# Patient Record
Sex: Female | Born: 1960
Health system: Southern US, Community
[De-identification: ages and names within clinical notes are randomized; demographics above are authoritative.]

## PROBLEM LIST (undated history)

## (undated) DIAGNOSIS — B009 Herpesviral infection, unspecified: Secondary | ICD-10-CM

## (undated) DIAGNOSIS — F32A Depression, unspecified: Secondary | ICD-10-CM

## (undated) DIAGNOSIS — Z9889 Other specified postprocedural states: Secondary | ICD-10-CM

## (undated) DIAGNOSIS — R112 Nausea with vomiting, unspecified: Secondary | ICD-10-CM

## (undated) DIAGNOSIS — M1990 Unspecified osteoarthritis, unspecified site: Secondary | ICD-10-CM

## (undated) HISTORY — PX: JOINT REPLACEMENT: SHX530

## (undated) HISTORY — PX: SHOULDER ARTHROSCOPY: SHX128

## (undated) HISTORY — DX: Herpesviral infection, unspecified: B00.9

---

## 1997-09-03 ENCOUNTER — Ambulatory Visit: Admission: RE | Admit: 1997-09-03 | Discharge: 1997-09-03 | Payer: Self-pay | Admitting: Family Medicine

## 1998-12-23 ENCOUNTER — Ambulatory Visit (HOSPITAL_COMMUNITY): Admission: RE | Admit: 1998-12-23 | Discharge: 1998-12-23 | Payer: Self-pay | Admitting: *Deleted

## 1998-12-23 ENCOUNTER — Encounter: Payer: Self-pay | Admitting: *Deleted

## 1999-05-03 ENCOUNTER — Encounter: Payer: Self-pay | Admitting: Orthopedic Surgery

## 1999-05-03 ENCOUNTER — Encounter: Admission: RE | Admit: 1999-05-03 | Discharge: 1999-05-03 | Payer: Self-pay | Admitting: Orthopedic Surgery

## 1999-05-08 ENCOUNTER — Ambulatory Visit (HOSPITAL_BASED_OUTPATIENT_CLINIC_OR_DEPARTMENT_OTHER): Admission: RE | Admit: 1999-05-08 | Discharge: 1999-05-09 | Payer: Self-pay | Admitting: Orthopedic Surgery

## 1999-08-22 ENCOUNTER — Other Ambulatory Visit: Admission: RE | Admit: 1999-08-22 | Discharge: 1999-08-22 | Payer: Self-pay | Admitting: Gynecology

## 1999-09-08 ENCOUNTER — Encounter: Payer: Self-pay | Admitting: General Surgery

## 1999-09-08 ENCOUNTER — Encounter: Admission: RE | Admit: 1999-09-08 | Discharge: 1999-09-08 | Payer: Self-pay | Admitting: General Surgery

## 2000-09-03 ENCOUNTER — Other Ambulatory Visit: Admission: RE | Admit: 2000-09-03 | Discharge: 2000-09-03 | Payer: Self-pay | Admitting: Gynecology

## 2000-09-09 ENCOUNTER — Encounter: Payer: Self-pay | Admitting: General Surgery

## 2000-09-09 ENCOUNTER — Encounter: Admission: RE | Admit: 2000-09-09 | Discharge: 2000-09-09 | Payer: Self-pay | Admitting: General Surgery

## 2001-09-10 ENCOUNTER — Encounter: Payer: Self-pay | Admitting: General Surgery

## 2001-09-10 ENCOUNTER — Encounter: Admission: RE | Admit: 2001-09-10 | Discharge: 2001-09-10 | Payer: Self-pay | Admitting: General Surgery

## 2001-09-11 ENCOUNTER — Other Ambulatory Visit: Admission: RE | Admit: 2001-09-11 | Discharge: 2001-09-11 | Payer: Self-pay | Admitting: Gynecology

## 2002-03-05 ENCOUNTER — Encounter: Payer: Self-pay | Admitting: General Surgery

## 2002-03-05 ENCOUNTER — Ambulatory Visit (HOSPITAL_COMMUNITY): Admission: RE | Admit: 2002-03-05 | Discharge: 2002-03-05 | Payer: Self-pay | Admitting: General Surgery

## 2002-03-06 ENCOUNTER — Ambulatory Visit (HOSPITAL_COMMUNITY): Admission: RE | Admit: 2002-03-06 | Discharge: 2002-03-06 | Payer: Self-pay | Admitting: General Surgery

## 2002-03-06 ENCOUNTER — Encounter: Payer: Self-pay | Admitting: General Surgery

## 2002-09-14 ENCOUNTER — Encounter: Admission: RE | Admit: 2002-09-14 | Discharge: 2002-09-14 | Payer: Self-pay | Admitting: Gynecology

## 2002-09-14 ENCOUNTER — Encounter: Payer: Self-pay | Admitting: Gynecology

## 2002-10-16 ENCOUNTER — Emergency Department (HOSPITAL_COMMUNITY): Admission: EM | Admit: 2002-10-16 | Discharge: 2002-10-16 | Payer: Self-pay | Admitting: Emergency Medicine

## 2003-03-11 ENCOUNTER — Encounter (HOSPITAL_COMMUNITY): Admission: RE | Admit: 2003-03-11 | Discharge: 2003-06-09 | Payer: Self-pay | Admitting: General Surgery

## 2003-09-24 ENCOUNTER — Encounter: Admission: RE | Admit: 2003-09-24 | Discharge: 2003-09-24 | Payer: Self-pay | Admitting: General Surgery

## 2004-03-10 ENCOUNTER — Encounter: Admission: RE | Admit: 2004-03-10 | Discharge: 2004-03-10 | Payer: Self-pay | Admitting: General Surgery

## 2004-03-10 ENCOUNTER — Encounter (HOSPITAL_COMMUNITY): Admission: RE | Admit: 2004-03-10 | Discharge: 2004-06-08 | Payer: Self-pay | Admitting: General Surgery

## 2004-08-30 ENCOUNTER — Other Ambulatory Visit: Admission: RE | Admit: 2004-08-30 | Discharge: 2004-08-30 | Payer: Self-pay | Admitting: Gynecology

## 2005-03-19 ENCOUNTER — Encounter: Admission: RE | Admit: 2005-03-19 | Discharge: 2005-03-19 | Payer: Self-pay | Admitting: General Surgery

## 2005-09-03 ENCOUNTER — Other Ambulatory Visit: Admission: RE | Admit: 2005-09-03 | Discharge: 2005-09-03 | Payer: Self-pay | Admitting: Gynecology

## 2006-03-20 ENCOUNTER — Encounter: Admission: RE | Admit: 2006-03-20 | Discharge: 2006-03-20 | Payer: Self-pay | Admitting: General Surgery

## 2007-03-24 ENCOUNTER — Encounter: Admission: RE | Admit: 2007-03-24 | Discharge: 2007-03-24 | Payer: Self-pay | Admitting: General Surgery

## 2010-04-23 ENCOUNTER — Encounter: Payer: Self-pay | Admitting: General Surgery
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
## 2012-11-16 ENCOUNTER — Emergency Department (HOSPITAL_COMMUNITY)
Admission: EM | Admit: 2012-11-16 | Discharge: 2012-11-16 | Disposition: A | Discharge status: Home / Self Care | Payer: BC Managed Care – PPO | Attending: Emergency Medicine | Admitting: Emergency Medicine

## 2012-11-16 ENCOUNTER — Emergency Department (HOSPITAL_COMMUNITY): Payer: BC Managed Care – PPO

## 2012-11-16 ENCOUNTER — Encounter (HOSPITAL_COMMUNITY): Payer: Self-pay

## 2012-11-16 DIAGNOSIS — S01112A Laceration without foreign body of left eyelid and periocular area, initial encounter: Secondary | ICD-10-CM

## 2012-11-16 DIAGNOSIS — S8990XA Unspecified injury of unspecified lower leg, initial encounter: Secondary | ICD-10-CM | POA: Insufficient documentation

## 2012-11-16 DIAGNOSIS — IMO0002 Reserved for concepts with insufficient information to code with codable children: Secondary | ICD-10-CM | POA: Insufficient documentation

## 2012-11-16 DIAGNOSIS — T07XXXA Unspecified multiple injuries, initial encounter: Secondary | ICD-10-CM

## 2012-11-16 DIAGNOSIS — S8391XA Sprain of unspecified site of right knee, initial encounter: Secondary | ICD-10-CM

## 2012-11-16 DIAGNOSIS — S058X9A Other injuries of unspecified eye and orbit, initial encounter: Secondary | ICD-10-CM | POA: Insufficient documentation

## 2012-11-16 DIAGNOSIS — S99929A Unspecified injury of unspecified foot, initial encounter: Secondary | ICD-10-CM | POA: Insufficient documentation

## 2012-11-16 DIAGNOSIS — S0003XA Contusion of scalp, initial encounter: Secondary | ICD-10-CM | POA: Insufficient documentation

## 2012-11-16 DIAGNOSIS — S0083XA Contusion of other part of head, initial encounter: Secondary | ICD-10-CM

## 2012-11-16 DIAGNOSIS — Y92410 Unspecified street and highway as the place of occurrence of the external cause: Secondary | ICD-10-CM | POA: Insufficient documentation

## 2012-11-16 DIAGNOSIS — S0990XA Unspecified injury of head, initial encounter: Secondary | ICD-10-CM

## 2012-11-16 DIAGNOSIS — Z23 Encounter for immunization: Secondary | ICD-10-CM | POA: Insufficient documentation

## 2012-11-16 DIAGNOSIS — Y9389 Activity, other specified: Secondary | ICD-10-CM | POA: Insufficient documentation

## 2012-11-16 DIAGNOSIS — Z79899 Other long term (current) drug therapy: Secondary | ICD-10-CM | POA: Insufficient documentation

## 2012-11-16 DIAGNOSIS — Z87891 Personal history of nicotine dependence: Secondary | ICD-10-CM | POA: Insufficient documentation

## 2012-11-16 DIAGNOSIS — Z792 Long term (current) use of antibiotics: Secondary | ICD-10-CM | POA: Insufficient documentation

## 2012-11-16 HISTORY — DX: Unspecified osteoarthritis, unspecified site: M19.90

## 2012-11-16 MED ORDER — CYCLOBENZAPRINE HCL 10 MG PO TABS: 10.0000 mg | ORAL_TABLET | Freq: Two times a day (BID) | ORAL | Status: DC | PRN

## 2012-11-16 MED ORDER — TETANUS-DIPHTH-ACELL PERTUSSIS 5-2.5-18.5 LF-MCG/0.5 IM SUSP
0.5000 mL | Freq: Once | INTRAMUSCULAR | Status: AC
  Administered 2012-11-16: 0.5 mL via INTRAMUSCULAR
  Filled 2012-11-16: qty 0.5

## 2012-11-16 MED ORDER — IBUPROFEN 200 MG PO TABS
600.0000 mg | ORAL_TABLET | Freq: Once | ORAL | Status: AC
  Administered 2012-11-16: 600 mg via ORAL
  Filled 2012-11-16: qty 3

## 2012-11-16 MED ORDER — HYDROCODONE-ACETAMINOPHEN 5-325 MG PO TABS: 1.0000 | ORAL_TABLET | Freq: Four times a day (QID) | ORAL | Status: DC | PRN

## 2012-11-16 MED ORDER — IBUPROFEN 600 MG PO TABS: 600.0000 mg | ORAL_TABLET | Freq: Four times a day (QID) | ORAL | Status: DC | PRN

## 2012-11-16 MED ORDER — OXYCODONE-ACETAMINOPHEN 5-325 MG PO TABS: 1.0000 | ORAL_TABLET | Freq: Once | ORAL | Status: DC

## 2012-11-16 NOTE — ED Notes (Signed)
Pt does not want Percocet at this time.  Will monitor.

## 2012-11-16 NOTE — ED Notes (Signed)
Pt was given an ice pack for her face, right hand, right knee and left ankle

## 2012-11-16 NOTE — ED Provider Notes (Signed)
Medical screening examination/treatment/procedure(s) were performed by non-physician practitioner and as supervising physician I was immediately available for consultation/collaboration.  Layla Maw Jourdyn Hasler, DO 11/16/12 1656
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
## 2013-04-20 ENCOUNTER — Other Ambulatory Visit: Payer: Self-pay

## 2013-04-20 DIAGNOSIS — Z1231 Encounter for screening mammogram for malignant neoplasm of breast: Secondary | ICD-10-CM

## 2013-05-12 ENCOUNTER — Ambulatory Visit
Admission: RE | Admit: 2013-05-12 | Discharge: 2013-05-12 | Disposition: A | Discharge status: Home / Self Care | Payer: BC Managed Care – PPO | Source: Ambulatory Visit

## 2013-05-12 DIAGNOSIS — Z1231 Encounter for screening mammogram for malignant neoplasm of breast: Secondary | ICD-10-CM

## 2013-05-18 ENCOUNTER — Ambulatory Visit: Payer: Self-pay | Admitting: Gynecology

## 2013-05-18 ENCOUNTER — Ambulatory Visit (INDEPENDENT_AMBULATORY_CARE_PROVIDER_SITE_OTHER): Payer: BC Managed Care – PPO | Admitting: Gynecology

## 2013-05-18 ENCOUNTER — Encounter: Payer: Self-pay | Admitting: Gynecology

## 2013-05-18 ENCOUNTER — Other Ambulatory Visit (HOSPITAL_COMMUNITY)
Admission: RE | Admit: 2013-05-18 | Discharge: 2013-05-18 | Disposition: A | Discharge status: Home / Self Care | Payer: BC Managed Care – PPO | Source: Ambulatory Visit | Attending: Gynecology | Admitting: Gynecology

## 2013-05-18 VITALS — BP 112/64 | Ht 67.0 in | Wt 135.0 lb

## 2013-05-18 DIAGNOSIS — Z01419 Encounter for gynecological examination (general) (routine) without abnormal findings: Secondary | ICD-10-CM

## 2013-05-18 DIAGNOSIS — Z1151 Encounter for screening for human papillomavirus (HPV): Secondary | ICD-10-CM | POA: Insufficient documentation

## 2013-05-18 LAB — CBC WITH DIFFERENTIAL/PLATELET
Basophils Absolute: 0 10*3/uL (ref 0.0–0.1)
Basophils Relative: 1 % (ref 0–1)
Eosinophils Absolute: 0.1 10*3/uL (ref 0.0–0.7)
Eosinophils Relative: 3 % (ref 0–5)
HCT: 35.7 % — ABNORMAL LOW (ref 36.0–46.0)
Hemoglobin: 12.9 g/dL (ref 12.0–15.0)
Lymphocytes Relative: 43 % (ref 12–46)
Lymphs Abs: 1.8 10*3/uL (ref 0.7–4.0)
MCH: 33 pg (ref 26.0–34.0)
MCHC: 36.1 g/dL — ABNORMAL HIGH (ref 30.0–36.0)
MCV: 91.3 fL (ref 78.0–100.0)
Monocytes Absolute: 0.3 10*3/uL (ref 0.1–1.0)
Monocytes Relative: 7 % (ref 3–12)
Neutro Abs: 2 10*3/uL (ref 1.7–7.7)
Neutrophils Relative %: 46 % (ref 43–77)
Platelets: 347 10*3/uL (ref 150–400)
RBC: 3.91 MIL/uL (ref 3.87–5.11)
RDW: 12.8 % (ref 11.5–15.5)
WBC: 4.2 10*3/uL (ref 4.0–10.5)

## 2013-05-18 LAB — COMPREHENSIVE METABOLIC PANEL
ALT: 15 U/L (ref 0–35)
AST: 11 U/L (ref 0–37)
Albumin: 4.5 g/dL (ref 3.5–5.2)
Alkaline Phosphatase: 50 U/L (ref 39–117)
BUN: 9 mg/dL (ref 6–23)
CO2: 30 mEq/L (ref 19–32)
Calcium: 9.7 mg/dL (ref 8.4–10.5)
Chloride: 103 mEq/L (ref 96–112)
Creat: 0.71 mg/dL (ref 0.50–1.10)
Glucose, Bld: 86 mg/dL (ref 70–99)
Potassium: 3.9 mEq/L (ref 3.5–5.3)
Sodium: 140 mEq/L (ref 135–145)
Total Bilirubin: 0.3 mg/dL (ref 0.2–1.2)
Total Protein: 6.8 g/dL (ref 6.0–8.3)

## 2013-05-18 LAB — LIPID PANEL
Cholesterol: 153 mg/dL (ref 0–200)
HDL: 45 mg/dL (ref >39)
LDL Cholesterol: 96 mg/dL (ref 0–99)
Total CHOL/HDL Ratio: 3.4 Ratio
Triglycerides: 58 mg/dL (ref <150)
VLDL: 12 mg/dL (ref 0–40)

## 2013-05-18 NOTE — Patient Instructions (Signed)
Follow up in one year, sooner as needed. 

## 2013-05-18 NOTE — Addendum Note (Signed)
Addended by: Dayna BarkerGARDNER, Gaia Gullikson K on: 05/18/2013 02:15 PM   Modules accepted: Orders

## 2013-05-18 NOTE — Progress Notes (Signed)
[facility] 03/19/1961 [id number]        53 y.o.  G2P0020 new patient for annual exam.  Former patient Dr. Nicholas LoseLomax although has not been seen in several years.  Past medical history,surgical history, problem list, medications, allergies, family history and social history were all reviewed and documented in the EPIC chart.  ROS:  Performed and pertinent positives and negatives are included in the history, assessment and plan .  Exam: Kim assistant Filed Vitals:   05/18/13 1327  BP: 112/64  Height: 5\' 7"  (1.702 m)  Weight: 135 lb (61.236 kg)   General appearance  Normal Skin grossly normal Head/Neck normal with no cervical or supraclavicular adenopathy thyroid normal Lungs  clear Cardiac RR, without RMG Abdominal  soft, nontender, without masses, organomegaly or hernia Breasts  examined lying and sitting without masses, retractions, discharge or axillary adenopathy. Pelvic  Ext/BUS/vagina  Normal  Cervix  Normal. Pap/HPV  Uterus  anteverted, normal size, shape and contour, midline and mobile nontender   Adnexa  Without masses or tenderness    Anus and perineum  Normal   Rectovaginal  Normal sphincter tone without palpated masses or tenderness.    Assessment/Plan:  53 y.o. 562P0020 female for annual exam.   1. Postmenopausal with LMP 2013. No bleeding since then. Not having significant hot flashes or night sweats. Currently not sexually active. Will monitor symptoms. Followup if any issues. Patient knows to report any vaginal bleeding. 2. Pap smear 2013. Pap/HPV today. No history of abnormal Pap smears previously. Plan 3-5 year repeat is normal. 3. Mammography 05/2013. Continue with annual mammography. SBE monthly reviewed. 4. Health maintenance. Baseline CBC comprehensive metabolic panel lipid profile urinalysis TSH vitamin D ordered. Followup one year, sooner as needed.   Note: This document was prepared with digital dictation and possible smart phrase technology. Any  transcriptional errors that result from this process are unintentional.   Dara LordsFONTAINE,Cortny Bambach P MD, 2:00 PM 05/18/2013

## 2013-05-19 LAB — URINALYSIS W MICROSCOPIC + REFLEX CULTURE
Bacteria, UA: NONE SEEN
Bilirubin Urine: NEGATIVE
Casts: NONE SEEN
Crystals: NONE SEEN
Glucose, UA: NEGATIVE mg/dL
Hgb urine dipstick: NEGATIVE
Ketones, ur: NEGATIVE mg/dL
Leukocytes, UA: NEGATIVE
Nitrite: NEGATIVE
Protein, ur: NEGATIVE mg/dL
Specific Gravity, Urine: 1.005 (ref 1.005–1.030)
Squamous Epithelial / HPF: NONE SEEN
Urobilinogen, UA: 0.2 mg/dL (ref 0.0–1.0)
pH: 6.5 (ref 5.0–8.0)

## 2013-05-19 LAB — VITAMIN D 25 HYDROXY (VIT D DEFICIENCY, FRACTURES): Vit D, 25-Hydroxy: 76 ng/mL (ref 30–89)

## 2013-05-19 LAB — TSH: TSH: 1.991 u[IU]/mL (ref 0.350–4.500)

## 2014-02-01 ENCOUNTER — Encounter: Payer: Self-pay | Admitting: Gynecology

## 2014-02-02 ENCOUNTER — Other Ambulatory Visit: Payer: Self-pay | Admitting: Dermatology

## 2014-04-12 ENCOUNTER — Other Ambulatory Visit: Payer: Self-pay

## 2014-04-12 DIAGNOSIS — Z1231 Encounter for screening mammogram for malignant neoplasm of breast: Secondary | ICD-10-CM

## 2014-05-14 ENCOUNTER — Ambulatory Visit
Admission: RE | Admit: 2014-05-14 | Discharge: 2014-05-14 | Disposition: A | Discharge status: Home / Self Care | Payer: BLUE CROSS/BLUE SHIELD | Source: Ambulatory Visit

## 2014-05-14 DIAGNOSIS — Z1231 Encounter for screening mammogram for malignant neoplasm of breast: Secondary | ICD-10-CM

## 2014-05-21 ENCOUNTER — Encounter: Payer: BC Managed Care – PPO | Admitting: Gynecology

## 2014-06-28 DIAGNOSIS — M7989 Other specified soft tissue disorders: Secondary | ICD-10-CM | POA: Insufficient documentation

## 2014-07-01 ENCOUNTER — Ambulatory Visit
Admission: RE | Admit: 2014-07-01 | Discharge: 2014-07-01 | Disposition: A | Discharge status: Home / Self Care | Payer: BLUE CROSS/BLUE SHIELD | Source: Ambulatory Visit | Attending: Sports Medicine | Admitting: Sports Medicine

## 2014-07-01 ENCOUNTER — Ambulatory Visit (INDEPENDENT_AMBULATORY_CARE_PROVIDER_SITE_OTHER): Payer: BLUE CROSS/BLUE SHIELD | Admitting: Sports Medicine

## 2014-07-01 ENCOUNTER — Encounter: Payer: Self-pay | Admitting: Sports Medicine

## 2014-07-01 VITALS — BP 126/70 | Ht 68.0 in | Wt 135.0 lb

## 2014-07-01 DIAGNOSIS — S76319A Strain of muscle, fascia and tendon of the posterior muscle group at thigh level, unspecified thigh, initial encounter: Secondary | ICD-10-CM

## 2014-07-01 DIAGNOSIS — F988 Other specified behavioral and emotional disorders with onset usually occurring in childhood and adolescence: Secondary | ICD-10-CM | POA: Insufficient documentation

## 2014-07-01 MED ORDER — MELOXICAM 7.5 MG PO TABS: 7.5000 mg | ORAL_TABLET | Freq: Every day | ORAL | Status: DC

## 2014-07-01 NOTE — Patient Instructions (Signed)
Matt Clance [email or web address] [phone]   Hamstring Strain with Rehab The hamstring muscle and tendons are vulnerable to muscle or tendon tear (strain). Hamstring tears cause pain and inflammation in the backside of the thigh, where the hamstring muscles are located. The hamstring is comprised of three muscles that are responsible for straightening the hip, bending the knee, and stabilizing the knee. These muscles are important for walking, running, and jumping. Hamstring strain is the most common injury of the thigh. Hamstring strains are classified as grade 1 or 2 strains. Grade 1 strains cause pain, but the tendon is not lengthened. Grade 2 strains include a lengthened ligament due to the ligament being stretched or partially ruptured. With grade 2 strains there is still function, although the function may be decreased.  SYMPTOMS   Pain, tenderness, swelling, warmth, or redness over the hamstring muscles, at the back of the thigh.  Pain that gets worse during and after intense activity.  A "pop" heard in the area, at the time of injury.  Muscle spasm in the hamstring muscles.  Pain or weakness with running, jumping, or bending the knee against resistance.  Crackling sound (crepitation) when the tendon is moved or touched.  Bruising (contusion) in the thigh within 48 hours of injury.  Loss of fullness of the muscle, or area of muscle bulging in the case of a complete rupture. CAUSES  A muscle strain occurs when a force is placed on the muscle or tendon that is greater than it can withstand. Common causes of injury include:  Strain from overuse or sudden increase in the frequency, duration, or intensity of activity.  Single violent blow or force to the back of the knee or the hamstring area of the thigh. RISK INCREASES WITH:  Sports that require quick starts (sprinting, racquetball, tennis).  Sports that require jumping (basketball, volleyball).  Kicking  sports and water skiing.  Contact sports (soccer, football).  Poor strength and flexibility.  Failure to warm up properly before activity.  Previous thigh, knee, or pelvis injury.  Poor exercise technique.  Poor posture. PREVENTION  Maintain physical fitness:  Strength, flexibility, and endurance.  Cardiovascular fitness.  Learn and use proper exercise technique and posture.  Wear proper fitted and padded protective equipment. PROGNOSIS  If treated properly, hamstring strains are usually curable in 2 to 6 weeks. RELATED COMPLICATIONS   Longer healing time, if not properly treated or if not given adequate time to heal.  Chronically inflamed tendon, causing persistent pain with activity that may progress to constant pain.  Recurring symptoms, if activity is resumed too soon.  Vulnerable to repeated injury (in up to 25% of cases). TREATMENT  Treatment first involves the use of ice and medication to help reduce pain and inflammation. It is also important to complete strengthening and stretching exercises, as well as modifying any activities that aggravate the symptoms. These exercises may be completed at home or with a therapist. Your caregiver may recommend the use of crutches to help reduce pain and discomfort, especially is the strain is severe enough to cause limping. If the tendon has pulled away from the bone, then surgery is necessary to reattach it. MEDICATION   If pain medicine is needed, nonsteroidal anti-inflammatory medicines (aspirin and ibuprofen), or other minor pain relievers (acetaminophen), are often advised.  Do not take pain medicine for 7 days before surgery.  Prescription pain relievers may be given if your caregiver thinks they are needed. Use only as directed and only as much as you  need.  Corticosteroid injections may be recommended. However, these injections should only be used for serious cases, as they can only be given a certain number of  times.  Ointments applied to the skin may be beneficial. HEAT AND COLD  Cold treatment (icing) relieves pain and reduces inflammation. Cold treatment should be applied for 10 to 15 minutes every 2 to 3 hours, and immediately after activity that aggravates your symptoms. Use ice packs or an ice massage.  Heat treatment may be used before performing the stretching and strengthening activities prescribed by your caregiver, physical therapist, or athletic trainer. Use a heat pack or a warm water soak. SEEK MEDICAL CARE IF:   Symptoms get worse or do not improve in 2 weeks, despite treatment.  New, unexplained symptoms develop. (Drugs used in treatment may produce side effects.) . Document Released: 03/19/2005 Document Revised: 06/11/2011 Document Reviewed: 07/01/2008 [facility]ExitCare Patient Information 2015 [place]. This information is not intended to replace advice given to you by your health care provider. Make sure you discuss any questions you have with your health care provider.

## 2014-07-09 DIAGNOSIS — S76319A Strain of muscle, fascia and tendon of the posterior muscle group at thigh level, unspecified thigh, initial encounter: Secondary | ICD-10-CM | POA: Insufficient documentation

## 2014-07-09 NOTE — Progress Notes (Signed)
  Christine Rangel - 54 y.o. female MRN [id number]  Date of birth: 06/01/1960  SUBJECTIVE: CC: 1.  bilateral gluteal pain, reevaluation      HPI:   patient reports for reevaluation of bilateral gluteal pain. Reports experiencing an episode in 2013 was training for marathon in doing sprints in the cold weather where I lateral gluteals cramped in bilateral hamstring cramps occurred at the same time. She's had intermittent pain since that time. Worse following cycling especially on hilly courses  Was spitting and yoga last week  & experience left hamstring irritation been having pain with settings that time.  Denies fevers, chills, night sweats  No pain radiating down past the posterior aspect of the leg.  Denies any numbness, surgical anesthesia or midline back pain      ROS:  per HPI    HISTORY:  Past Medical, Surgical, Social, and Family History reviewed & updated per EMR.  Pertinent Historical Findings include: Social History   Occupational History  . Not on file.   Social History Main Topics  . Smoking status: Former Games developermoker  . Smokeless tobacco: Not on file  . Alcohol Use: Yes     Comment: Rare  . Drug Use: No  . Sexual Activity: No    No specialty comments available. Problem  Hamstring Muscle Strain    OBJECTIVE:  VS:   HT:5\' 8"  (172.7 cm)   WT:135 lb (61.236 kg)  BMI:20.6          BP:126/70 mmHg  HR: bpm  TEMP: ( )  RESP:   PHYSICAL EXAM: GENERAL: Adult Caucasian female. No acute distress PSYCH: Alert and appropriately interactive. Slightly anxious SKIN: No open skin lesions or abnormal skin markings on areas inspected as below VASCULAR: Bilateral DP and PT pulses 2+/4, no pretibial edema NEURO: Lower extremity strength is 5+/5 in all myotomes; sensation is intact to light touch in all dermatomes. BILATERAL LEGS: Bilateral negative straight leg raise, popliteal inguinal in the left 28, 110 on the right No significant gluteal pain to palpation. No significant  sciatic notch tenderness. OSTEOPATHIC EXAM: Left anterior innominate, ASIS compression test positive on left. Right on right sacral torsion. Leg lengths are symmetric  ASSESSMENT & PLAN: See problem based charting & AVS for additional documentation Problem List Items Addressed This Visit    Hamstring muscle strain - Primary    Acute muscle strain in the setting of bilateral gluteal pain Discussed the bike fit as this may be contributing to her bilateral hamstring and gluteal tightness especially in the seated position. Seems to be worse with hilly courses suggesting over recruitment secondary to position on climbs. Information for [employer] at [employer] for a Ret[employer] bike fit Eval with plain film x-rays of the back for evaluation of potential degenerative changes that may be causing a neuromuscular irritability predisposing her for injury Core stabilization exercises, hamstring eccentric exercises & change in position recommended Discussed the patient and obtain video pictures of her on the bicycle I can potentially make suggestions return.       Relevant Orders   DG Lumbar Spine 2-3 Views (Completed)     FOLLOW UP:  Return in about 6 weeks (around 08/12/2014).

## 2014-07-09 NOTE — Assessment & Plan Note (Signed)
Acute muscle strain in the setting of bilateral gluteal pain Discussed the bike fit as this may be contributing to her bilateral hamstring and gluteal tightness especially in the seated position. Seems to be worse with hilly courses suggesting over recruitment secondary to position on climbs. Information for [employer] for a Retule bike fit Eval with plain film x-rays of the back for evaluation of potential degenerative changes that may be causing a neuromuscular irritability predisposing her for injury Core stabilization exercises, hamstring eccentric exercises & change in position recommended Discussed the patient and obtain video pictures of her on the bicycle I can potentially make suggestions return.

## 2014-07-16 ENCOUNTER — Encounter: Payer: Self-pay | Admitting: Gynecology

## 2014-07-16 ENCOUNTER — Ambulatory Visit (INDEPENDENT_AMBULATORY_CARE_PROVIDER_SITE_OTHER): Payer: BLUE CROSS/BLUE SHIELD | Admitting: Gynecology

## 2014-07-16 VITALS — BP 118/74 | Ht 67.0 in | Wt 138.0 lb

## 2014-07-16 DIAGNOSIS — Z01419 Encounter for gynecological examination (general) (routine) without abnormal findings: Secondary | ICD-10-CM

## 2014-07-16 LAB — COMPREHENSIVE METABOLIC PANEL
ALT: 19 U/L (ref 0–35)
AST: 19 U/L (ref 0–37)
Albumin: 4.4 g/dL (ref 3.5–5.2)
Alkaline Phosphatase: 59 U/L (ref 39–117)
BUN: 8 mg/dL (ref 6–23)
CALCIUM: 9.8 mg/dL (ref 8.4–10.5)
CHLORIDE: 103 meq/L (ref 96–112)
CO2: 29 mEq/L (ref 19–32)
Creat: 0.74 mg/dL (ref 0.50–1.10)
Glucose, Bld: 69 mg/dL — ABNORMAL LOW (ref 70–99)
POTASSIUM: 4.4 meq/L (ref 3.5–5.3)
Sodium: 137 mEq/L (ref 135–145)
Total Bilirubin: 0.7 mg/dL (ref 0.2–1.2)
Total Protein: 6.9 g/dL (ref 6.0–8.3)

## 2014-07-16 LAB — CBC WITH DIFFERENTIAL/PLATELET
BASOS ABS: 0 10*3/uL (ref 0.0–0.1)
Basophils Relative: 0 % (ref 0–1)
EOS PCT: 2 % (ref 0–5)
Eosinophils Absolute: 0.1 10*3/uL (ref 0.0–0.7)
HCT: 39.8 % (ref 36.0–46.0)
Hemoglobin: 13.5 g/dL (ref 12.0–15.0)
LYMPHS PCT: 38 % (ref 12–46)
Lymphs Abs: 1.8 10*3/uL (ref 0.7–4.0)
MCH: 31.9 pg (ref 26.0–34.0)
MCHC: 33.9 g/dL (ref 30.0–36.0)
MCV: 94.1 fL (ref 78.0–100.0)
MONO ABS: 0.4 10*3/uL (ref 0.1–1.0)
MPV: 8.5 fL — ABNORMAL LOW (ref 8.6–12.4)
Monocytes Relative: 8 % (ref 3–12)
Neutro Abs: 2.4 10*3/uL (ref 1.7–7.7)
Neutrophils Relative %: 52 % (ref 43–77)
Platelets: 333 10*3/uL (ref 150–400)
RBC: 4.23 MIL/uL (ref 3.87–5.11)
RDW: 12.9 % (ref 11.5–15.5)
WBC: 4.7 10*3/uL (ref 4.0–10.5)

## 2014-07-16 LAB — LIPID PANEL
Cholesterol: 161 mg/dL (ref 0–200)
HDL: 41 mg/dL — ABNORMAL LOW (ref >=46)
LDL Cholesterol: 111 mg/dL — ABNORMAL HIGH (ref 0–99)
Total CHOL/HDL Ratio: 3.9 Ratio
Triglycerides: 46 mg/dL (ref <150)
VLDL: 9 mg/dL (ref 0–40)

## 2014-07-16 LAB — TSH: TSH: 1.631 u[IU]/mL (ref 0.350–4.500)

## 2014-07-16 NOTE — Patient Instructions (Signed)
You may obtain a copy of any labs that were done today by logging onto MyChart as outlined in the instructions provided with your AVS (after visit summary). The office will not call with normal lab results but certainly if there are any significant abnormalities then we will contact you.   Health Maintenance, Female A healthy lifestyle and preventative care can promote health and wellness.  Maintain regular health, dental, and eye exams.  Eat a healthy diet. Foods like vegetables, fruits, whole grains, low-fat dairy products, and lean protein foods contain the nutrients you need without too many calories. Decrease your intake of foods high in solid fats, added sugars, and salt. Get information about a proper diet from your caregiver, if necessary.  Regular physical exercise is one of the most important things you can do for your health. Most adults should get at least 150 minutes of moderate-intensity exercise (any activity that increases your heart rate and causes you to sweat) each week. In addition, most adults need muscle-strengthening exercises on 2 or more days a week.   Maintain a healthy weight. The body mass index (BMI) is a screening tool to identify possible weight problems. It provides an estimate of body fat based on height and weight. Your caregiver can help determine your BMI, and can help you achieve or maintain a healthy weight. For adults 20 years and older:  A BMI below 18.5 is considered underweight.  A BMI of 18.5 to 24.9 is normal.  A BMI of 25 to 29.9 is considered overweight.  A BMI of 30 and above is considered obese.  Maintain normal blood lipids and cholesterol by exercising and minimizing your intake of saturated fat. Eat a balanced diet with plenty of fruits and vegetables. Blood tests for lipids and cholesterol should begin at age 61 and be repeated every 5 years. If your lipid or cholesterol levels are high, you are over 50, or you are a high risk for heart  disease, you may need your cholesterol levels checked more frequently.Ongoing high lipid and cholesterol levels should be treated with medicines if diet and exercise are not effective.  If you smoke, find out from your caregiver how to quit. If you do not use tobacco, do not start.  Lung cancer screening is recommended for adults aged 33 80 years who are at high risk for developing lung cancer because of a history of smoking. Yearly low-dose computed tomography (CT) is recommended for people who have at least a 30-pack-year history of smoking and are a current smoker or have quit within the past 15 years. A pack year of smoking is smoking an average of 1 pack of cigarettes a day for 1 year (for example: 1 pack a day for 30 years or 2 packs a day for 15 years). Yearly screening should continue until the smoker has stopped smoking for at least 15 years. Yearly screening should also be stopped for people who develop a health problem that would prevent them from having lung cancer treatment.  If you are pregnant, do not drink alcohol. If you are breastfeeding, be very cautious about drinking alcohol. If you are not pregnant and choose to drink alcohol, do not exceed 1 drink per day. One drink is considered to be 12 ounces (355 mL) of beer, 5 ounces (148 mL) of wine, or 1.5 ounces (44 mL) of liquor.  Avoid use of street drugs. Do not share needles with anyone. Ask for help if you need support or instructions about stopping  the use of drugs.  High blood pressure causes heart disease and increases the risk of stroke. Blood pressure should be checked at least every 1 to 2 years. Ongoing high blood pressure should be treated with medicines, if weight loss and exercise are not effective.  If you are 59 to 53 years old, ask your caregiver if you should take aspirin to prevent strokes.  Diabetes screening involves taking a blood sample to check your fasting blood sugar level. This should be done once every 3  years, after age 91, if you are within normal weight and without risk factors for diabetes. Testing should be considered at a younger age or be carried out more frequently if you are overweight and have at least 1 risk factor for diabetes.  Breast cancer screening is essential preventative care for women. You should practice "breast self-awareness." This means understanding the normal appearance and feel of your breasts and may include breast self-examination. Any changes detected, no matter how small, should be reported to a caregiver. Women in their 66s and 30s should have a clinical breast exam (CBE) by a caregiver as part of a regular health exam every 1 to 3 years. After age 101, women should have a CBE every year. Starting at age 100, women should consider having a mammogram (breast X-ray) every year. Women who have a family history of breast cancer should talk to their caregiver about genetic screening. Women at a high risk of breast cancer should talk to their caregiver about having an MRI and a mammogram every year.  Breast cancer gene (BRCA)-related cancer risk assessment is recommended for women who have family members with BRCA-related cancers. BRCA-related cancers include breast, ovarian, tubal, and peritoneal cancers. Having family members with these cancers may be associated with an increased risk for harmful changes (mutations) in the breast cancer genes BRCA1 and BRCA2. Results of the assessment will determine the need for genetic counseling and BRCA1 and BRCA2 testing.  The Pap test is a screening test for cervical cancer. Women should have a Pap test starting at age 57. Between ages 25 and 35, Pap tests should be repeated every 2 years. Beginning at age 37, you should have a Pap test every 3 years as long as the past 3 Pap tests have been normal. If you had a hysterectomy for a problem that was not cancer or a condition that could lead to cancer, then you no longer need Pap tests. If you are  between ages 50 and 76, and you have had normal Pap tests going back 10 years, you no longer need Pap tests. If you have had past treatment for cervical cancer or a condition that could lead to cancer, you need Pap tests and screening for cancer for at least 20 years after your treatment. If Pap tests have been discontinued, risk factors (such as a new sexual partner) need to be reassessed to determine if screening should be resumed. Some women have medical problems that increase the chance of getting cervical cancer. In these cases, your caregiver may recommend more frequent screening and Pap tests.  The human papillomavirus (HPV) test is an additional test that may be used for cervical cancer screening. The HPV test looks for the virus that can cause the cell changes on the cervix. The cells collected during the Pap test can be tested for HPV. The HPV test could be used to screen women aged 44 years and older, and should be used in women of any age  who have unclear Pap test results. After the age of 55, women should have HPV testing at the same frequency as a Pap test.  Colorectal cancer can be detected and often prevented. Most routine colorectal cancer screening begins at the age of 44 and continues through age 20. However, your caregiver may recommend screening at an earlier age if you have risk factors for colon cancer. On a yearly basis, your caregiver may provide home test kits to check for hidden blood in the stool. Use of a small camera at the end of a tube, to directly examine the colon (sigmoidoscopy or colonoscopy), can detect the earliest forms of colorectal cancer. Talk to your caregiver about this at age 86, when routine screening begins. Direct examination of the colon should be repeated every 5 to 10 years through age 13, unless early forms of pre-cancerous polyps or small growths are found.  Hepatitis C blood testing is recommended for all people born from [birth year] through 1964 and any  individual with known risks for hepatitis C.  Practice safe sex. Use condoms and avoid high-risk sexual practices to reduce the spread of sexually transmitted infections (STIs). Sexually active women aged 36 and younger should be checked for Chlamydia, which is a common sexually transmitted infection. Older women with new or multiple partners should also be tested for Chlamydia. Testing for other STIs is recommended if you are sexually active and at increased risk.  Osteoporosis is a disease in which the bones lose minerals and strength with aging. This can result in serious bone fractures. The risk of osteoporosis can be identified using a bone density scan. Women ages 20 and over and women at risk for fractures or osteoporosis should discuss screening with their caregivers. Ask your caregiver whether you should be taking a calcium supplement or vitamin D to reduce the rate of osteoporosis.  Menopause can be associated with physical symptoms and risks. Hormone replacement therapy is available to decrease symptoms and risks. You should talk to your caregiver about whether hormone replacement therapy is right for you.  Use sunscreen. Apply sunscreen liberally and repeatedly throughout the day. You should seek shade when your shadow is shorter than you. Protect yourself by wearing long sleeves, pants, a wide-brimmed hat, and sunglasses year round, whenever you are outdoors.  Notify your caregiver of new moles or changes in moles, especially if there is a change in shape or color. Also notify your caregiver if a mole is larger than the size of a pencil eraser.  Stay current with your immunizations. Document Released: 10/02/2010 Document Revised: 07/14/2012 Document Reviewed: 10/02/2010 [facility] Patient Information 2014 [place].

## 2014-07-16 NOTE — Progress Notes (Signed)
[facility] 03/28/1961 [id number]        54 y.o.  G2P0020 for annual exam.  Doing well without complaints  Past medical history,surgical history, problem list, medications, allergies, family history and social history were all reviewed and documented as reviewed in the EPIC chart.  ROS:  Performed with pertinent positives and negatives included in the history, assessment and plan.   Additional significant findings :  none   Exam: [facility] personassistant Filed Vitals:   07/16/14 0911  BP: 118/74  Height: 5\' 7"  (1.702 m)  Weight: 138 lb (62.596 kg)   General appearance:  Normal affect, orientation and appearance. Skin: Grossly normal HEENT: Without gross lesions.  No cervical or supraclavicular adenopathy. Thyroid normal.  Lungs:  Clear without wheezing, rales or rhonchi Cardiac: RR, without RMG Abdominal:  Soft, nontender, without masses, guarding, rebound, organomegaly or hernia Breasts:  Examined lying and sitting without masses, retractions, discharge or axillary adenopathy. Pelvic:  Ext/BUS/vagina normal  Cervix normal  Uterus anteverted, normal size, shape and contour, midline and mobile nontender   Adnexa  Without masses or tenderness    Anus and perineum  Normal   Rectovaginal  Normal sphincter tone without palpated masses or tenderness.    Assessment/Plan:  54 y.o. 62P0020 female for annual exam.   1. Postmenopausal. Without significant hot flushes, night sweats or vaginal dryness. No vaginal bleeding. Continue to monitor and report any vaginal bleeding. 2. Pap smear/HPV 2015 negative. No Pap smear done today. No history of significant abnormal Pap smears previously. Plan repeat Pap smear in 3-5 year interval per current screening guidelines. 3. Mammography 05/2014. Continue with annual mammography. SBE monthly reviewed. 4. Colonoscopy 2013. Repeat at their recommended interval. 5. DEXA never. Will plan further into the menopause. Check vitamin D level today. 6. Health  maintenance. Baseline CBC, comprehensive metabolic panel, lipid profile, vitamin D, TSH, urinalysis. Follow up 1 year, sooner as needed.     Dara LordsFONTAINE,Dicy Smigel P MD, 9:33 AM 07/16/2014

## 2014-07-17 LAB — URINALYSIS W MICROSCOPIC + REFLEX CULTURE
BILIRUBIN URINE: NEGATIVE
Bacteria, UA: NONE SEEN
Casts: NONE SEEN
Crystals: NONE SEEN
Glucose, UA: NEGATIVE mg/dL
Hgb urine dipstick: NEGATIVE
KETONES UR: NEGATIVE mg/dL
LEUKOCYTES UA: NEGATIVE
Nitrite: NEGATIVE
PROTEIN: NEGATIVE mg/dL
Specific Gravity, Urine: 1.005 (ref 1.005–1.030)
Squamous Epithelial / LPF: NONE SEEN
Urobilinogen, UA: 0.2 mg/dL (ref 0.0–1.0)
pH: 7.5 (ref 5.0–8.0)

## 2014-07-17 LAB — VITAMIN D 25 HYDROXY (VIT D DEFICIENCY, FRACTURES): Vit D, 25-Hydroxy: 59 ng/mL (ref 30–100)

## 2014-11-01 HISTORY — PX: HAMSTRING AVULSION REPAIR: SHX5019

## 2014-12-28 ENCOUNTER — Other Ambulatory Visit: Payer: Self-pay | Admitting: Physician Assistant

## 2014-12-28 NOTE — H&P (Signed)
Christine Rangel comes in for follow up.  Six weeks out from hamstring repair on the right.  Minimal symptoms there.  Really doing great.  Anxious to return to activity.  She is ready to proceed on the left.    EXAMINATION: On her exam today her hamstring flexibility isn't bad.  Her symptoms and her pain are dramatically better than pre-op already.  She remains very symptomatic on the left.    DISPOSITION:  She is going to start some gentle stretching on the right.  Nothing aggressive yet.  We are going to go ahead and proceed with operative intervention on the opposite left side.  Doing this in the same manner as the right.  All of this discussed with her and she understands.  We are going to set this up in the near future.    Loreta Ave, M.D.

## 2015-01-05 ENCOUNTER — Encounter (HOSPITAL_BASED_OUTPATIENT_CLINIC_OR_DEPARTMENT_OTHER): Payer: Self-pay | Admitting: *Deleted

## 2015-01-12 NOTE — Anesthesia Preprocedure Evaluation (Addendum)
Anesthesia Evaluation  Patient identified by MRN, date of birth, ID band Patient awake    Reviewed: Allergy & Precautions, NPO status , Patient's Chart, lab work & pertinent test results  History of Anesthesia Complications (+) PONV and history of anesthetic complications  Airway Mallampati: I   Neck ROM: Full    Dental  (+) Teeth Intact   Pulmonary former smoker,    breath sounds clear to auscultation       Cardiovascular negative cardio ROS   Rhythm:Regular Rate:Normal     Neuro/Psych PSYCHIATRIC DISORDERS  Neuromuscular disease    GI/Hepatic negative GI ROS, Neg liver ROS,   Endo/Other  negative endocrine ROS  Renal/GU negative Renal ROS  negative genitourinary   Musculoskeletal  (+) Arthritis ,   Abdominal   Peds negative pediatric ROS (+)  Hematology negative hematology ROS (+)   Anesthesia Other Findings   Reproductive/Obstetrics negative OB ROS                            Lab Results  Component Value Date   WBC 4.7 07/16/2014   HGB 13.5 07/16/2014   HCT 39.8 07/16/2014   MCV 94.1 07/16/2014   PLT 333 07/16/2014   Lab Results  Component Value Date   CREATININE 0.74 07/16/2014   BUN 8 07/16/2014   NA 137 07/16/2014   K 4.4 07/16/2014   CL 103 07/16/2014   CO2 29 07/16/2014     Anesthesia Physical Anesthesia Plan  ASA: II  Anesthesia Plan: General   Post-op Pain Management:    Induction: Intravenous  Airway Management Planned: Oral ETT  Additional Equipment:   Intra-op Plan:   Post-operative Plan: Extubation in OR  Informed Consent: I have reviewed the patients History and Physical, chart, labs and discussed the procedure including the risks, benefits and alternatives for the proposed anesthesia with the patient or authorized representative who has indicated his/her understanding and acceptance.   Dental advisory given  Plan Discussed with:  CRNA  Anesthesia Plan Comments:         Anesthesia Quick Evaluation

## 2015-01-13 ENCOUNTER — Ambulatory Visit (HOSPITAL_BASED_OUTPATIENT_CLINIC_OR_DEPARTMENT_OTHER): Payer: BLUE CROSS/BLUE SHIELD | Admitting: Anesthesiology

## 2015-01-13 ENCOUNTER — Encounter (HOSPITAL_BASED_OUTPATIENT_CLINIC_OR_DEPARTMENT_OTHER)
Admission: RE | Disposition: A | Discharge status: Home / Self Care | Payer: Self-pay | Source: Ambulatory Visit | Attending: Orthopedic Surgery

## 2015-01-13 ENCOUNTER — Ambulatory Visit (HOSPITAL_BASED_OUTPATIENT_CLINIC_OR_DEPARTMENT_OTHER)
Admission: RE | Admit: 2015-01-13 | Discharge: 2015-01-13 | Disposition: A | Discharge status: Home / Self Care | Payer: BLUE CROSS/BLUE SHIELD | Source: Ambulatory Visit | Attending: Orthopedic Surgery | Admitting: Orthopedic Surgery

## 2015-01-13 ENCOUNTER — Encounter (HOSPITAL_BASED_OUTPATIENT_CLINIC_OR_DEPARTMENT_OTHER): Payer: Self-pay

## 2015-01-13 DIAGNOSIS — S76392A Other specified injury of muscle, fascia and tendon of the posterior muscle group at thigh level, left thigh, initial encounter: Secondary | ICD-10-CM | POA: Diagnosis present

## 2015-01-13 DIAGNOSIS — Z87891 Personal history of nicotine dependence: Secondary | ICD-10-CM | POA: Diagnosis not present

## 2015-01-13 DIAGNOSIS — X58XXXA Exposure to other specified factors, initial encounter: Secondary | ICD-10-CM | POA: Insufficient documentation

## 2015-01-13 HISTORY — DX: Other specified postprocedural states: Z98.890

## 2015-01-13 HISTORY — DX: Nausea with vomiting, unspecified: R11.2

## 2015-01-13 HISTORY — PX: HAMSTRING AVULSION REPAIR: SHX5019

## 2015-01-13 LAB — POCT HEMOGLOBIN-HEMACUE: HEMOGLOBIN: 13.1 g/dL (ref 12.0–15.0)

## 2015-01-13 SURGERY — REPAIR, AVULSION, HAMSTRING
Anesthesia: General | Site: Hip | Laterality: Left

## 2015-01-13 MED ORDER — MEPERIDINE HCL 25 MG/ML IJ SOLN: 6.2500 mg | INTRAMUSCULAR | Status: DC | PRN

## 2015-01-13 MED ORDER — LACTATED RINGERS IV SOLN
INTRAVENOUS | Status: DC
  Administered 2015-01-13: 08:00:00 via INTRAVENOUS

## 2015-01-13 MED ORDER — OXYCODONE-ACETAMINOPHEN 5-325 MG PO TABS: 1.0000 | ORAL_TABLET | ORAL | Status: DC | PRN

## 2015-01-13 MED ORDER — CEFAZOLIN SODIUM-DEXTROSE 2-3 GM-% IV SOLR
INTRAVENOUS | Status: AC
  Filled 2015-01-13: qty 50

## 2015-01-13 MED ORDER — LACTATED RINGERS IV SOLN: INTRAVENOUS | Status: DC

## 2015-01-13 MED ORDER — PROPOFOL 10 MG/ML IV BOLUS
INTRAVENOUS | Status: DC | PRN
  Administered 2015-01-13: 130 mg via INTRAVENOUS

## 2015-01-13 MED ORDER — FENTANYL CITRATE (PF) 100 MCG/2ML IJ SOLN
INTRAMUSCULAR | Status: AC
  Filled 2015-01-13: qty 4

## 2015-01-13 MED ORDER — GLYCOPYRROLATE 0.2 MG/ML IJ SOLN: 0.2000 mg | Freq: Once | INTRAMUSCULAR | Status: DC | PRN

## 2015-01-13 MED ORDER — DEXTROSE 5 % IV SOLN: 500.0000 mg | Freq: Four times a day (QID) | INTRAVENOUS | Status: DC | PRN

## 2015-01-13 MED ORDER — ONDANSETRON HCL 4 MG/2ML IJ SOLN
INTRAMUSCULAR | Status: DC | PRN
  Administered 2015-01-13: 4 mg via INTRAVENOUS

## 2015-01-13 MED ORDER — ONDANSETRON HCL 4 MG/2ML IJ SOLN
INTRAMUSCULAR | Status: AC
  Filled 2015-01-13: qty 2

## 2015-01-13 MED ORDER — MIDAZOLAM HCL 2 MG/2ML IJ SOLN
1.0000 mg | INTRAMUSCULAR | Status: DC | PRN
  Administered 2015-01-13: 2 mg via INTRAVENOUS

## 2015-01-13 MED ORDER — ONDANSETRON HCL 4 MG PO TABS: 4.0000 mg | ORAL_TABLET | Freq: Four times a day (QID) | ORAL | Status: DC | PRN

## 2015-01-13 MED ORDER — FENTANYL CITRATE (PF) 100 MCG/2ML IJ SOLN
50.0000 ug | INTRAMUSCULAR | Status: AC | PRN
  Administered 2015-01-13: 100 ug via INTRAVENOUS
  Administered 2015-01-13 (×3): 25 ug via INTRAVENOUS

## 2015-01-13 MED ORDER — KETOROLAC TROMETHAMINE 30 MG/ML IJ SOLN
INTRAMUSCULAR | Status: AC
  Filled 2015-01-13: qty 1

## 2015-01-13 MED ORDER — DEXAMETHASONE SODIUM PHOSPHATE 10 MG/ML IJ SOLN
INTRAMUSCULAR | Status: AC
  Filled 2015-01-13: qty 1

## 2015-01-13 MED ORDER — PROMETHAZINE HCL 25 MG/ML IJ SOLN: 6.2500 mg | INTRAMUSCULAR | Status: DC | PRN

## 2015-01-13 MED ORDER — BUPIVACAINE HCL (PF) 0.5 % IJ SOLN
INTRAMUSCULAR | Status: AC
  Filled 2015-01-13: qty 60

## 2015-01-13 MED ORDER — SCOPOLAMINE 1 MG/3DAYS TD PT72
MEDICATED_PATCH | TRANSDERMAL | Status: AC
  Filled 2015-01-13: qty 1

## 2015-01-13 MED ORDER — BUPIVACAINE-EPINEPHRINE (PF) 0.5% -1:200000 IJ SOLN
INTRAMUSCULAR | Status: AC
  Filled 2015-01-13: qty 30

## 2015-01-13 MED ORDER — SCOPOLAMINE 1 MG/3DAYS TD PT72
1.0000 | MEDICATED_PATCH | Freq: Once | TRANSDERMAL | Status: DC | PRN
  Administered 2015-01-13: 1.5 mg via TRANSDERMAL

## 2015-01-13 MED ORDER — SUCCINYLCHOLINE CHLORIDE 20 MG/ML IJ SOLN
INTRAMUSCULAR | Status: DC | PRN
  Administered 2015-01-13: 80 mg via INTRAVENOUS

## 2015-01-13 MED ORDER — METOCLOPRAMIDE HCL 5 MG/ML IJ SOLN: 5.0000 mg | Freq: Three times a day (TID) | INTRAMUSCULAR | Status: DC | PRN

## 2015-01-13 MED ORDER — METHOCARBAMOL 500 MG PO TABS: 500.0000 mg | ORAL_TABLET | Freq: Four times a day (QID) | ORAL | Status: DC | PRN

## 2015-01-13 MED ORDER — ONDANSETRON HCL 4 MG PO TABS: 4.0000 mg | ORAL_TABLET | Freq: Three times a day (TID) | ORAL | Status: DC | PRN

## 2015-01-13 MED ORDER — HYDROMORPHONE HCL 1 MG/ML IJ SOLN
INTRAMUSCULAR | Status: AC
  Filled 2015-01-13: qty 1

## 2015-01-13 MED ORDER — MIDAZOLAM HCL 2 MG/2ML IJ SOLN
INTRAMUSCULAR | Status: AC
  Filled 2015-01-13: qty 4

## 2015-01-13 MED ORDER — ONDANSETRON HCL 4 MG/2ML IJ SOLN: 4.0000 mg | Freq: Four times a day (QID) | INTRAMUSCULAR | Status: DC | PRN

## 2015-01-13 MED ORDER — CEFAZOLIN SODIUM-DEXTROSE 2-3 GM-% IV SOLR
2.0000 g | INTRAVENOUS | Status: AC
  Administered 2015-01-13: 2 g via INTRAVENOUS

## 2015-01-13 MED ORDER — PROPOFOL 500 MG/50ML IV EMUL
INTRAVENOUS | Status: AC
  Filled 2015-01-13: qty 50

## 2015-01-13 MED ORDER — BUPIVACAINE HCL (PF) 0.5 % IJ SOLN
INTRAMUSCULAR | Status: DC | PRN
  Administered 2015-01-13: 16 mL

## 2015-01-13 MED ORDER — HYDROMORPHONE HCL 1 MG/ML IJ SOLN: 0.5000 mg | INTRAMUSCULAR | Status: DC | PRN

## 2015-01-13 MED ORDER — SUCCINYLCHOLINE CHLORIDE 20 MG/ML IJ SOLN
INTRAMUSCULAR | Status: AC
  Filled 2015-01-13: qty 1

## 2015-01-13 MED ORDER — CHLORHEXIDINE GLUCONATE 4 % EX LIQD: 60.0000 mL | Freq: Once | CUTANEOUS | Status: DC

## 2015-01-13 MED ORDER — HYDROMORPHONE HCL 1 MG/ML IJ SOLN
0.2500 mg | INTRAMUSCULAR | Status: DC | PRN
  Administered 2015-01-13: 0.5 mg via INTRAVENOUS

## 2015-01-13 MED ORDER — METOCLOPRAMIDE HCL 5 MG PO TABS: 5.0000 mg | ORAL_TABLET | Freq: Three times a day (TID) | ORAL | Status: DC | PRN

## 2015-01-13 MED ORDER — LIDOCAINE HCL (CARDIAC) 20 MG/ML IV SOLN
INTRAVENOUS | Status: DC | PRN
  Administered 2015-01-13: 60 mg via INTRAVENOUS

## 2015-01-13 MED ORDER — LIDOCAINE HCL (CARDIAC) 20 MG/ML IV SOLN
INTRAVENOUS | Status: AC
  Filled 2015-01-13: qty 5

## 2015-01-13 MED ORDER — KETOROLAC TROMETHAMINE 30 MG/ML IJ SOLN
INTRAMUSCULAR | Status: DC | PRN
  Administered 2015-01-13: 30 mg via INTRAVENOUS

## 2015-01-13 MED ORDER — DEXAMETHASONE SODIUM PHOSPHATE 4 MG/ML IJ SOLN
INTRAMUSCULAR | Status: DC | PRN
  Administered 2015-01-13: 10 mg via INTRAVENOUS

## 2015-01-13 SURGICAL SUPPLY — 53 items
ANCH SUT 2 CRKSRW FT 14X4.5 (Anchor) ×1 IMPLANT
ANCHOR PEEK CORKSCREW 4.5 (Anchor) ×1 IMPLANT
ANCHOR PEEK CORKSCREW 4.5MM (Anchor) ×1 IMPLANT
APL SKNCLS STERI-STRIP NONHPOA (GAUZE/BANDAGES/DRESSINGS) ×3
BENZOIN TINCTURE PRP APPL 2/3 (GAUZE/BANDAGES/DRESSINGS) ×6 IMPLANT
BLADE SURG 10 STRL SS (BLADE) ×2 IMPLANT
BLADE SURG 15 STRL LF DISP TIS (BLADE) ×1 IMPLANT
BLADE SURG 15 STRL SS (BLADE) ×3
CANISTER SUCT 1200ML W/VALVE (MISCELLANEOUS) ×1 IMPLANT
CLOSURE WOUND 1/2 X4 (GAUZE/BANDAGES/DRESSINGS)
DECANTER SPIKE VIAL GLASS SM (MISCELLANEOUS) ×2 IMPLANT
DRAPE INCISE IOBAN 66X45 STRL (DRAPES) ×2 IMPLANT
DRAPE LAPAROTOMY 100X72 PEDS (DRAPES) ×2 IMPLANT
DRAPE U-SHAPE 76X120 STRL (DRAPES) ×1 IMPLANT
DRSG PAD ABDOMINAL 8X10 ST (GAUZE/BANDAGES/DRESSINGS) ×2 IMPLANT
DURAPREP 26ML APPLICATOR (WOUND CARE) ×3 IMPLANT
ELECT REM PT RETURN 9FT ADLT (ELECTROSURGICAL) ×3
ELECTRODE REM PT RTRN 9FT ADLT (ELECTROSURGICAL) ×1 IMPLANT
GAUZE SPONGE 4X4 12PLY STRL (GAUZE/BANDAGES/DRESSINGS) ×3 IMPLANT
GAUZE SPONGE 4X4 16PLY XRAY LF (GAUZE/BANDAGES/DRESSINGS) IMPLANT
GAUZE XEROFORM 1X8 LF (GAUZE/BANDAGES/DRESSINGS) IMPLANT
GLOVE BIOGEL PI IND STRL 7.0 (GLOVE) ×1 IMPLANT
GLOVE BIOGEL PI INDICATOR 7.0 (GLOVE) ×6
GLOVE ECLIPSE 6.5 STRL STRAW (GLOVE) ×4 IMPLANT
GLOVE ECLIPSE 7.0 STRL STRAW (GLOVE) ×3 IMPLANT
GLOVE SURG ORTHO 8.0 STRL STRW (GLOVE) ×3 IMPLANT
GOWN STRL REUS W/ TWL LRG LVL3 (GOWN DISPOSABLE) ×2 IMPLANT
GOWN STRL REUS W/ TWL XL LVL3 (GOWN DISPOSABLE) ×1 IMPLANT
GOWN STRL REUS W/TWL LRG LVL3 (GOWN DISPOSABLE) ×6
GOWN STRL REUS W/TWL XL LVL3 (GOWN DISPOSABLE) ×3
MANIFOLD NEPTUNE II (INSTRUMENTS) ×2 IMPLANT
NDL MAYO TROCAR (NEEDLE) IMPLANT
NEEDLE MAYO TROCAR (NEEDLE) ×3 IMPLANT
NS IRRIG 1000ML POUR BTL (IV SOLUTION) ×3 IMPLANT
PACK ARTHROSCOPY DSU (CUSTOM PROCEDURE TRAY) ×3 IMPLANT
PACK BASIN DAY SURGERY FS (CUSTOM PROCEDURE TRAY) ×3 IMPLANT
PENCIL BUTTON HOLSTER BLD 10FT (ELECTRODE) ×3 IMPLANT
SLEEVE SCD COMPRESS KNEE MED (MISCELLANEOUS) ×2 IMPLANT
SPONGE LAP 18X18 X RAY DECT (DISPOSABLE) ×3 IMPLANT
SPONGE LAP 4X18 X RAY DECT (DISPOSABLE) IMPLANT
STAPLER VISISTAT 35W (STAPLE) IMPLANT
STRIP CLOSURE SKIN 1/2X4 (GAUZE/BANDAGES/DRESSINGS) IMPLANT
SUT FIBERWIRE #2 38 T-5 BLUE (SUTURE) ×3
SUT PROLENE 3 0 PS 2 (SUTURE) IMPLANT
SUT VIC AB 0 CT1 27 (SUTURE) ×3
SUT VIC AB 0 CT1 27XBRD ANBCTR (SUTURE) IMPLANT
SUT VIC AB 2-0 PS2 27 (SUTURE) IMPLANT
SUT VIC AB 2-0 SH 27 (SUTURE) ×3
SUT VIC AB 2-0 SH 27XBRD (SUTURE) IMPLANT
SUTURE FIBERWR #2 38 T-5 BLUE (SUTURE) ×2 IMPLANT
SYR BULB 3OZ (MISCELLANEOUS) ×3 IMPLANT
TOWEL OR NON WOVEN STRL DISP B (DISPOSABLE) ×2 IMPLANT
YANKAUER SUCT BULB TIP NO VENT (SUCTIONS) ×3 IMPLANT

## 2015-01-13 NOTE — Progress Notes (Signed)
Patient requested that scop patch be removed.  Removed scop patch in PACU-Phase 2.

## 2015-01-13 NOTE — Transfer of Care (Signed)
Immediate Anesthesia Transfer of Care Note  Patient: Christine Rangel  Procedure(s) Performed: Procedure(s): REPAIR LEFT PROXIMAL HAMSTRING AVULSION FROM PELVIS  (Left)  Patient Location: PACU  Anesthesia Type:General  Level of Consciousness: awake and sedated  Airway & Oxygen Therapy: Patient Spontanous Breathing and Patient connected to face mask oxygen  Post-op Assessment: Report given to RN and Post -op Vital signs reviewed and stable  Post vital signs: Reviewed and stable  Last Vitals:  Filed Vitals:   01/13/15 0734  BP: 106/72  Pulse: 69  Temp: 36.6 C  Resp: 20    Complications: No apparent anesthesia complications

## 2015-01-13 NOTE — Discharge Instructions (Signed)
Weight bearing as tolerated but do not flex the hip Do not remove dressings Do not get dressings wet May apply ice for up to 20 minutes at a time for pain and swelling Follow up appointment in one week    Post Anesthesia Home Care Instructions  Activity: Get plenty of rest for the remainder of the day. A responsible adult should stay with you for 24 hours following the procedure.  For the next 24 hours, DO NOT: -Drive a car -Advertising copywriterperate machinery -Drink alcoholic beverages -Take any medication unless instructed by your physician -Make any legal decisions or sign important papers.  Meals: Start with liquid foods such as gelatin or soup. Progress to regular foods as tolerated. Avoid greasy, spicy, heavy foods. If nausea and/or vomiting occur, drink only clear liquids until the nausea and/or vomiting subsides. Call your physician if vomiting continues.  Special Instructions/Symptoms: Your throat may feel dry or sore from the anesthesia or the breathing tube placed in your throat during surgery. If this causes discomfort, gargle with warm salt water. The discomfort should disappear within 24 hours.  If you had a scopolamine patch placed behind your ear for the management of post- operative nausea and/or vomiting:  1. The medication in the patch is effective for 72 hours, after which it should be removed.  Wrap patch in a tissue and discard in the trash. Wash hands thoroughly with soap and water. 2. You may remove the patch earlier than 72 hours if you experience unpleasant side effects which may include dry mouth, dizziness or visual disturbances. 3. Avoid touching the patch. Wash your hands with soap and water after contact with the patch.

## 2015-01-13 NOTE — Anesthesia Procedure Notes (Signed)
Procedure Name: Intubation Performed by: York GricePEARSON, Era Parr W Pre-anesthesia Checklist: Patient identified, Timeout performed, Emergency Drugs available, Suction available and Patient being monitored Patient Re-evaluated:Patient Re-evaluated prior to inductionOxygen Delivery Method: Circle system utilized Preoxygenation: Pre-oxygenation with 100% oxygen Intubation Type: IV induction Ventilation: Mask ventilation without difficulty Laryngoscope Size: Miller and 2 Tube size: 7.0 mm Number of attempts: 1 Airway Equipment and Method: Stylet Placement Confirmation: ETT inserted through vocal cords under direct vision,  positive ETCO2 and breath sounds checked- equal and bilateral Secured at: 22 cm Tube secured with: Tape Dental Injury: Teeth and Oropharynx as per pre-operative assessment

## 2015-01-13 NOTE — Anesthesia Postprocedure Evaluation (Signed)
  Anesthesia Post-op Note  Patient: Christine Rangel  Procedure(s) Performed: Procedure(s): REPAIR LEFT PROXIMAL HAMSTRING AVULSION FROM PELVIS  (Left)  Patient Location: PACU  Anesthesia Type:General  Level of Consciousness: awake, alert  and oriented  Airway and Oxygen Therapy: Patient Spontanous Breathing  Post-op Pain: mild  Post-op Assessment: Post-op Vital signs reviewed and Patient's Cardiovascular Status Stable LLE Motor Response: Purposeful movement LLE Sensation: Full sensation          Post-op Vital Signs: Reviewed and stable  Last Vitals:  Filed Vitals:   01/13/15 1145  BP: 106/72  Pulse: 64  Temp: 36.5 C  Resp: 16    Complications: No apparent anesthesia complications

## 2015-01-13 NOTE — Interval H&P Note (Signed)
History and Physical Interval Note:  01/13/2015 7:30 AM  Christine Rangel  has presented today for surgery, with the diagnosis of OTHER SPRAIN OF UNSPECIFIED HIP INITIAL ENCOUNTER   The various methods of treatment have been discussed with the patient and family. After consideration of risks, benefits and other options for treatment, the patient has consented to  Procedure(s): REPAIR LEFT PROXIMAL HAMSTRING AVULSION FROM PELVIS  (Left) as a surgical intervention .  The patient's history has been reviewed, patient examined, no change in status, stable for surgery.  I have reviewed the patient's chart and labs.  Questions were answered to the patient's satisfaction.     [name]

## 2015-01-14 NOTE — Op Note (Signed)
NAMReatha Harps:  Rangel, Christine Rangel                 ACCOUNT NO.:  [id number]  MEDICAL RECORD NO.:  [id number]  LOCATION:                               FACILITY:  [facility]  PHYSICIAN:  Loreta Aveaniel F. Casin Federici, M.D. 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  PROCEDURE:  Debridement and primary repair of hamstring tendon avulsion from the ischial tuberosity, left.  SURGEON:  Loreta Aveaniel F. Elwyn Lowden, M.D.  ASSISTANT:  Mikey KirschnerLindsey Stanberry, PA, present throughout the entire case and necessary for timely completion of procedure.  ANESTHESIA:  General.  BLOOD LOSS:  Minimal.  SPECIMENS:  None.  CULTURES:  None.  COMPLICATIONS:  None.  DRESSINGS:  Soft compressive.  DESCRIPTION OF PROCEDURE:  The patient was brought to the operating room and after adequate anesthesia had been obtained, turned to a prone position.  Appropriate padding.  Prepped and draped in usual sterile fashion.  A transverse incision at the gluteal crease.  Skin and subcutaneous tissue divided.  Fat retracted.  Gluteus elevated.  The ischial tuberosity exposed.  Tearing and degeneration especially lateral half of the common hamstring tendon extending down about 1.5 cm. Tuberosity exposed and roughened.  A 4.5 anchor was placed here.  We debrided, mobilized the common hamstring tendon, was then captured with both FiberWire sutures, weaved well distal and then proximal and then firmly tied down and repaired.  At completion, I had a great repair of the entire common hamstring tendon to the tuberosity.  Wound irrigated.  Retractors removed.  Subcutaneous and subcuticular closure.  Margins were  injected with Marcaine.  Sterile compressive dressing applied.  Returned to supine position.  Anesthesia reversed.  Brought to the recovery room. Tolerated the surgery well.  No complications.     Loreta Aveaniel F. Daleyza Gadomski, M.D.     DFM/MEDQ  D:  01/13/2015  T:  01/14/2015  Job:  [id number]

## 2015-01-17 ENCOUNTER — Encounter (HOSPITAL_BASED_OUTPATIENT_CLINIC_OR_DEPARTMENT_OTHER): Payer: Self-pay | Admitting: Orthopedic Surgery

## 2015-04-22 ENCOUNTER — Other Ambulatory Visit: Payer: Self-pay

## 2015-04-22 DIAGNOSIS — Z1231 Encounter for screening mammogram for malignant neoplasm of breast: Secondary | ICD-10-CM

## 2015-05-31 ENCOUNTER — Ambulatory Visit
Admission: RE | Admit: 2015-05-31 | Discharge: 2015-05-31 | Disposition: A | Discharge status: Home / Self Care | Payer: BLUE CROSS/BLUE SHIELD | Source: Ambulatory Visit

## 2015-05-31 DIAGNOSIS — Z1231 Encounter for screening mammogram for malignant neoplasm of breast: Secondary | ICD-10-CM

## 2015-07-18 ENCOUNTER — Encounter: Payer: BLUE CROSS/BLUE SHIELD | Admitting: Gynecology

## 2015-09-09 ENCOUNTER — Encounter: Payer: BLUE CROSS/BLUE SHIELD | Admitting: Gynecology

## 2015-11-11 ENCOUNTER — Ambulatory Visit (INDEPENDENT_AMBULATORY_CARE_PROVIDER_SITE_OTHER): Payer: BLUE CROSS/BLUE SHIELD | Admitting: Gynecology

## 2015-11-11 ENCOUNTER — Encounter: Payer: Self-pay | Admitting: Gynecology

## 2015-11-11 VITALS — BP 120/76 | Ht 67.0 in | Wt 142.0 lb

## 2015-11-11 DIAGNOSIS — Z1321 Encounter for screening for nutritional disorder: Secondary | ICD-10-CM | POA: Diagnosis not present

## 2015-11-11 DIAGNOSIS — Z1329 Encounter for screening for other suspected endocrine disorder: Secondary | ICD-10-CM | POA: Diagnosis not present

## 2015-11-11 DIAGNOSIS — Z13220 Encounter for screening for lipoid disorders: Secondary | ICD-10-CM

## 2015-11-11 DIAGNOSIS — Z01419 Encounter for gynecological examination (general) (routine) without abnormal findings: Secondary | ICD-10-CM

## 2015-11-11 NOTE — Patient Instructions (Signed)

## 2015-11-11 NOTE — Progress Notes (Signed)
    [facility] 06/27/1960 [id number]        55 y.o.  G2P0020  for annual exam.  Doing well without complaints  Past medical history,surgical history, problem list, medications, allergies, family history and social history were all reviewed and documented as reviewed in the EPIC chart.  ROS:  Performed with pertinent positives and negatives included in the history, assessment and plan.   Additional significant findings :  None   Exam: Kennon PortelaKim Gardner assistant Vitals:   11/11/15 1134  BP: 120/76  Weight: 142 lb (64.4 kg)  Height: 5\' 7"  (1.702 m)   Body mass index is 22.24 kg/m.  General appearance:  Normal affect, orientation and appearance. Skin: Grossly normal HEENT: Without gross lesions.  No cervical or supraclavicular adenopathy. Thyroid normal.  Lungs:  Clear without wheezing, rales or rhonchi Cardiac: RR, without RMG Abdominal:  Soft, nontender, without masses, guarding, rebound, organomegaly or hernia Breasts:  Examined lying and sitting without masses, retractions, discharge or axillary adenopathy. Pelvic:  Ext/BUS/Vagina normal  Cervix normal  Uterus anteverted, normal size, shape and contour, midline and mobile nontender   Adnexa without masses or tenderness    Anus and perineum normal   Rectovaginal normal sphincter tone without palpated masses or tenderness.    Assessment/Plan:  55 y.o. [id number] female for annual exam.   1. Postmenopausal. Doing well without significant hot flushes, night sweats, vaginal dryness or any vaginal bleeding. Continue monitor report any issues or bleeding. 2. Pap smear/HPV 2015 negative. No Pap smear done today. No history of significant abnormal Pap smears. Plan repeat Pap smear at 5 year interval. 3. Mammography 05/2015. Continue with annual mammography when due. SBE monthly reviewed. 4. Colonoscopy 2013. Repeat at their recommended interval. 5. DEXA never. Will plan further into the menopause. Patient's very active training for  triathlon. 6. Health maintenance. Future orders for fasting CBC, CMP, lipid profile, vitamin D, TSH, urinalysis placed. Follow up 1 year, sooner as needed.   Dara LordsFONTAINE,Tylan Briguglio P MD, 12:00 PM 11/11/2015

## 2015-11-12 LAB — URINALYSIS W MICROSCOPIC + REFLEX CULTURE
BACTERIA UA: NONE SEEN [HPF]
BILIRUBIN URINE: NEGATIVE
Casts: NONE SEEN [LPF]
Crystals: NONE SEEN [HPF]
GLUCOSE, UA: NEGATIVE
Hgb urine dipstick: NEGATIVE
Ketones, ur: NEGATIVE
Leukocytes, UA: NEGATIVE
Nitrite: NEGATIVE
PH: 6.5 (ref 5.0–8.0)
Protein, ur: NEGATIVE
RBC / HPF: NONE SEEN RBC/HPF (ref <=2)
SQUAMOUS EPITHELIAL / LPF: NONE SEEN [HPF] (ref <=5)
Specific Gravity, Urine: 1.005 (ref 1.001–1.035)
WBC, UA: NONE SEEN WBC/HPF (ref <=5)
YEAST: NONE SEEN [HPF]

## 2015-11-17 ENCOUNTER — Other Ambulatory Visit: Payer: BLUE CROSS/BLUE SHIELD

## 2015-11-17 DIAGNOSIS — Z1321 Encounter for screening for nutritional disorder: Secondary | ICD-10-CM

## 2015-11-17 DIAGNOSIS — Z13220 Encounter for screening for lipoid disorders: Secondary | ICD-10-CM

## 2015-11-17 DIAGNOSIS — Z01419 Encounter for gynecological examination (general) (routine) without abnormal findings: Secondary | ICD-10-CM

## 2015-11-17 DIAGNOSIS — Z1329 Encounter for screening for other suspected endocrine disorder: Secondary | ICD-10-CM

## 2015-11-17 LAB — CBC WITH DIFFERENTIAL/PLATELET
BASOS PCT: 0 %
Basophils Absolute: 0 cells/uL (ref 0–200)
EOS ABS: 190 {cells}/uL (ref 15–500)
EOS PCT: 5 %
HCT: 40.3 % (ref 35.0–45.0)
Hemoglobin: 13.5 g/dL (ref 11.7–15.5)
LYMPHS ABS: 1558 {cells}/uL (ref 850–3900)
LYMPHS PCT: 41 %
MCH: 31.5 pg (ref 27.0–33.0)
MCHC: 33.5 g/dL (ref 32.0–36.0)
MCV: 94.2 fL (ref 80.0–100.0)
MONO ABS: 304 {cells}/uL (ref 200–950)
MONOS PCT: 8 %
MPV: 9.1 fL (ref 7.5–12.5)
NEUTROS ABS: 1748 {cells}/uL (ref 1500–7800)
Neutrophils Relative %: 46 %
PLATELETS: 289 10*3/uL (ref 140–400)
RBC: 4.28 MIL/uL (ref 3.80–5.10)
RDW: 12.5 % (ref 11.0–15.0)
WBC: 3.8 10*3/uL (ref 3.8–10.8)

## 2015-11-17 LAB — COMPREHENSIVE METABOLIC PANEL
ALK PHOS: 65 U/L (ref 33–130)
ALT: 14 U/L (ref 6–29)
AST: 16 U/L (ref 10–35)
Albumin: 4.4 g/dL (ref 3.6–5.1)
BUN: 11 mg/dL (ref 7–25)
CO2: 21 mmol/L (ref 20–31)
Calcium: 9.9 mg/dL (ref 8.6–10.4)
Chloride: 106 mmol/L (ref 98–110)
Creat: 0.72 mg/dL (ref 0.50–1.05)
GLUCOSE: 79 mg/dL (ref 65–99)
POTASSIUM: 4.4 mmol/L (ref 3.5–5.3)
Sodium: 140 mmol/L (ref 135–146)
Total Bilirubin: 0.5 mg/dL (ref 0.2–1.2)
Total Protein: 7.1 g/dL (ref 6.1–8.1)

## 2015-11-17 LAB — LIPID PANEL
CHOL/HDL RATIO: 3.4 ratio (ref <=5.0)
Cholesterol: 134 mg/dL (ref 125–200)
HDL: 39 mg/dL — ABNORMAL LOW (ref >=46)
LDL Cholesterol: 85 mg/dL (ref <130)
Triglycerides: 48 mg/dL (ref <150)
VLDL: 10 mg/dL (ref <30)

## 2015-11-17 LAB — TSH: TSH: 2.53 m[IU]/L

## 2015-11-18 LAB — VITAMIN D 25 HYDROXY (VIT D DEFICIENCY, FRACTURES): VIT D 25 HYDROXY: 62 ng/mL (ref 30–100)

## 2016-01-10 ENCOUNTER — Ambulatory Visit (INDEPENDENT_AMBULATORY_CARE_PROVIDER_SITE_OTHER): Payer: BLUE CROSS/BLUE SHIELD

## 2016-01-10 ENCOUNTER — Encounter: Payer: Self-pay | Admitting: Sports Medicine

## 2016-01-10 ENCOUNTER — Ambulatory Visit (INDEPENDENT_AMBULATORY_CARE_PROVIDER_SITE_OTHER): Payer: BLUE CROSS/BLUE SHIELD | Admitting: Sports Medicine

## 2016-01-10 DIAGNOSIS — M79672 Pain in left foot: Secondary | ICD-10-CM

## 2016-01-10 DIAGNOSIS — S92812A Other fracture of left foot, initial encounter for closed fracture: Secondary | ICD-10-CM

## 2016-01-10 DIAGNOSIS — M779 Enthesopathy, unspecified: Secondary | ICD-10-CM | POA: Diagnosis not present

## 2016-01-10 MED ORDER — METHYLPREDNISOLONE 4 MG PO TBPK: ORAL_TABLET | ORAL | 0 refills | Status: DC

## 2016-01-10 MED ORDER — DICLOFENAC SODIUM 75 MG PO TBEC: 75.0000 mg | DELAYED_RELEASE_TABLET | Freq: Two times a day (BID) | ORAL | 0 refills | Status: DC

## 2016-01-10 NOTE — Progress Notes (Signed)
Subjective: Christine Rangel is a 55 y.o. female patient who presents to office for evaluation of Left foot pain since 11/20/15 after jumping in shallow swimming pool. Went to Dr. Eulah PontMurphy who intially told her it was broke and put her in CAM boot for 2 weeks then at follow up stated that it wasn't broke and gave her a hard insert. Reports no relief. Felt best when she was in boot but admits to swelling and pain underneath big toe joint with swelling and limp. 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  Patient denies any other pedal complaints.   Patient Active Problem List   Diagnosis Date Noted  . Hamstring muscle strain 07/09/2014  . ADD (attention deficit disorder) 07/01/2014  . Swelling of limb 06/28/2014    Current Outpatient Prescriptions on File Prior to Visit  Medication Sig Dispense Refill  . Calcium Carb-Cholecalciferol (CALCIUM + D3 PO) Take 1 tablet by mouth.    Marland Kitchen. glucosamine-chondroitin 500-400 MG tablet Take 1 tablet by mouth daily.    . Multiple Vitamin (MULTIVITAMIN WITH MINERALS) TABS tablet Take 1 tablet by mouth daily.    . Omega-3 Fatty Acids (FISH OIL PO) Take 2 capsules by mouth.    . valACYclovir (VALTREX) 1000 MG tablet Take 1,000 mg by mouth 2 (two) times daily.     No current facility-administered medications on file prior to visit.     Allergies  Allergen Reactions  . Morphine And Related Nausea And Vomiting    Objective:  General: Alert and oriented x3 in no acute distress  Dermatology: No open lesions bilateral lower extremities, no webspace macerations, no ecchymosis bilateral, all nails x 10 are well manicured.  Vascular: Focal edema noted to left 1st MTPJ foot mostly plantar aspect. Dorsalis Pedis and Posterior Tibial pedal pulses 2/4, Capillary Fill Time 3 seconds,(+) pedal hair growth bilateral, Temperature gradient within normal limits.  Neurology: Gross  sensation intact via light touch bilateral, Protective sensation intact  with Phoebe PerchSemmes Weinstein Monofilament to all pedal sites, No babinski sign present bilateral. (- )Tinels sign left foot.   Musculoskeletal: There is tenderness with palpation at 1st MTPJ Left foot, most at plantar aspect, There is not pain present with tuning fork to left however there is mild guarding due to pain,No pain with calf compression bilateral. All joint range of motion is within normal limits except at 1st MTPJ, Strength within normal limits in all groups bilateral.   Gait: Unassisted, Antalgic gait  Xrays  Left Foot   Impression: Normal osseous mineralization, No obvious fracture however due to nature of injury possible stress fracture or interruption of sesamoid complex, mild 1st MTPJ swelling. No other acute findings.     Assessment and Plan: Problem List Items Addressed This Visit    None    Visit Diagnoses    Left foot pain    -  Primary   Relevant Medications   methylPREDNISolone (MEDROL DOSEPAK) 4 MG TBPK tablet   diclofenac (VOLTAREN) 75 MG EC tablet   Other Relevant Orders   DG Foot 2 Views Left   Capsulitis       Relevant Medications   methylPREDNISolone (MEDROL DOSEPAK) 4 MG TBPK tablet   diclofenac (VOLTAREN) 75 MG EC tablet   Tendonitis       Relevant Medications   methylPREDNISolone (MEDROL DOSEPAK) 4 MG TBPK tablet   diclofenac (VOLTAREN) 75 MG EC tablet  Closed fracture of sesamoid bone of left foot, initial encounter       possible   Relevant Medications   methylPREDNISolone (MEDROL DOSEPAK) 4 MG TBPK tablet   diclofenac (VOLTAREN) 75 MG EC tablet       -Complete examination performed -Xrays reviewed -Discussed treatement options for 1st MTPJ sprain, tendonitis, capsulitis, with possible sesamoid stress fracture; risks, alternatives, and benefits explained. -Recommend MRI however patient declined at this time -Advised to return to CAM walker to patient to wear at all times and  instructed on use x 2 weeks thereafter if feeling better may transition to using offloading pad and good supportive shoes -Recommend low impact activities  -Recommend protection, rest, ice, elevation daily until symptoms improve -Rx Medrol dose pack and Diclofenac to take as instructed -May also continue with Tylenol PM at night for sleep -Patient to return to office in 3 weeks for follow up evaluation sooner if condition worsens.  Asencion Islam, DPM

## 2016-01-31 ENCOUNTER — Ambulatory Visit: Payer: BLUE CROSS/BLUE SHIELD | Admitting: Sports Medicine

## 2016-03-27 ENCOUNTER — Emergency Department (HOSPITAL_COMMUNITY): Payer: BLUE CROSS/BLUE SHIELD

## 2016-03-27 ENCOUNTER — Encounter (HOSPITAL_COMMUNITY): Payer: Self-pay

## 2016-03-27 ENCOUNTER — Emergency Department (HOSPITAL_COMMUNITY)
Admission: EM | Admit: 2016-03-27 | Discharge: 2016-03-27 | Disposition: A | Discharge status: Home / Self Care | Payer: BLUE CROSS/BLUE SHIELD | Attending: Emergency Medicine | Admitting: Emergency Medicine

## 2016-03-27 DIAGNOSIS — F909 Attention-deficit hyperactivity disorder, unspecified type: Secondary | ICD-10-CM | POA: Insufficient documentation

## 2016-03-27 DIAGNOSIS — Z87891 Personal history of nicotine dependence: Secondary | ICD-10-CM | POA: Diagnosis not present

## 2016-03-27 DIAGNOSIS — M436 Torticollis: Secondary | ICD-10-CM | POA: Insufficient documentation

## 2016-03-27 DIAGNOSIS — M542 Cervicalgia: Secondary | ICD-10-CM | POA: Diagnosis present

## 2016-03-27 DIAGNOSIS — Z96612 Presence of left artificial shoulder joint: Secondary | ICD-10-CM | POA: Insufficient documentation

## 2016-03-27 LAB — I-STAT CHEM 8, ED
BUN: 6 mg/dL (ref 6–20)
CALCIUM ION: 1.24 mmol/L (ref 1.15–1.40)
Chloride: 99 mmol/L — ABNORMAL LOW (ref 101–111)
Creatinine, Ser: 0.6 mg/dL (ref 0.44–1.00)
GLUCOSE: 110 mg/dL — AB (ref 65–99)
HCT: 37 % (ref 36.0–46.0)
HEMOGLOBIN: 12.6 g/dL (ref 12.0–15.0)
POTASSIUM: 3.8 mmol/L (ref 3.5–5.1)
Sodium: 139 mmol/L (ref 135–145)
TCO2: 27 mmol/L (ref 0–100)

## 2016-03-27 MED ORDER — DIAZEPAM 5 MG/ML IJ SOLN
5.0000 mg | Freq: Once | INTRAMUSCULAR | Status: AC
  Administered 2016-03-27: 5 mg via INTRAMUSCULAR
  Filled 2016-03-27: qty 2

## 2016-03-27 MED ORDER — OXYCODONE-ACETAMINOPHEN 5-325 MG PO TABS: 1.0000 | ORAL_TABLET | ORAL | 0 refills | Status: DC | PRN

## 2016-03-27 MED ORDER — OXYCODONE-ACETAMINOPHEN 5-325 MG PO TABS
1.0000 | ORAL_TABLET | Freq: Once | ORAL | Status: AC
  Administered 2016-03-27: 1 via ORAL
  Filled 2016-03-27: qty 1

## 2016-03-27 NOTE — ED Notes (Signed)
EDP at bedside  

## 2016-03-27 NOTE — ED Triage Notes (Signed)
Pt presents via EMS for evaluation of neck pain x 1 week. Pt. With hx of herniated disc in neck with similar pain, pt reports having norovirus last week with violent emesis. Pt reports pain could have been induced from vomiting. Pt. Denies dizziness/lightheadness or radiation of pain. Pt ambulatory in triage.

## 2016-03-27 NOTE — ED Notes (Signed)
Patient went to xray 

## 2016-03-27 NOTE — Discharge Instructions (Signed)
Heat, massage. Ibuprofen or aleve for pain. Percocet for severe pain. Take muscle relaxant. Try stretches. Follow up with family doctor. You can try to follow up with Cly[facility]Karen from [facility]. Return if any worsening symptoms or any new concerning symptoms.

## 2016-03-27 NOTE — ED Provider Notes (Signed)
[facility]-EMERGENCY DEPT Provider Note   CSN: [id number] Arrival date & time: 03/27/16  1245 By signing my name below, I, Levon HedgerElizabeth Hall, attest that this documentation has been prepared under the direction and in the presence of non-physician practitioner, Jaynie Crumbleatyana Anetta Olvera, PA-C. Electronically Signed: Levon HedgerElizabeth Hall, Scribe. 03/27/2016. 1:18 PM.   History   Chief Complaint Chief Complaint  Patient presents with  . Neck Pain   HPI Christine Rangel is a 55 y.o. female with a hx of herniated disk who presents to the Emergency Department complaining of progressively worsening, constant neck pain that does not radiate onset yesterday. Pt states she woke up yesterday with this pain and it has continues to worsen. Pt has taken Advil and robaxin with no relief. She notes associated stiffness and states she is unable to move her neck. Pt states she had norovirus last week and reports violent emesis. She denies fever, arm pain, numbness or weakness to arms, or any other associated symptoms.   The history is provided by the patient. No language interpreter was used.   Past Medical History:  Diagnosis Date  . Arthritis   . PONV (postoperative nausea and vomiting)    "couldn't wake me up" kept for overnight stay    Patient Active Problem List   Diagnosis Date Noted  . Hamstring muscle strain 07/09/2014  . ADD (attention deficit disorder) 07/01/2014  . Swelling of limb 06/28/2014    Past Surgical History:  Procedure Laterality Date  . HAMSTRING AVULSION REPAIR Right Aug, 2016  . HAMSTRING AVULSION REPAIR Left 01/13/2015   Procedure: REPAIR LEFT PROXIMAL HAMSTRING AVULSION FROM PELVIS ;  Surgeon: Loreta Aveaniel F Murphy, MD;  Location:  [facility];  Service: Orthopedics;  Laterality: Left;  . JOINT REPLACEMENT Left    shoulder  . SHOULDER ARTHROSCOPY Left 1995, 2001, 2006    OB History    Gravida Para Term Preterm AB Living   2       2 0   SAB TAB Ectopic Multiple Live Births   2                Home Medications    Prior to Admission medications   Medication Sig Start Date End Date Taking? Authorizing Provider  Calcium Carb-Cholecalciferol (CALCIUM + D3 PO) Take 1 tablet by mouth.    Historical Provider, MD  diclofenac (VOLTAREN) 75 MG EC tablet Take 1 tablet (75 mg total) by mouth 2 (two) times daily. 01/10/16   Asencion Islamitorya Stover, DPM  glucosamine-chondroitin 500-400 MG tablet Take 1 tablet by mouth daily.    Historical Provider, MD  methylPREDNISolone (MEDROL DOSEPAK) 4 MG TBPK tablet Take 1st as instructed 01/10/16   Asencion Islamitorya Stover, DPM  Multiple Vitamin (MULTIVITAMIN WITH MINERALS) TABS tablet Take 1 tablet by mouth daily.    Historical Provider, MD  Omega-3 Fatty Acids (FISH OIL PO) Take 2 capsules by mouth.    Historical Provider, MD  valACYclovir (VALTREX) 1000 MG tablet Take 1,000 mg by mouth 2 (two) times daily.    Historical Provider, MD    Family History Family History  Problem Relation Age of Onset  . Breast cancer Mother 1750  . Diabetes Maternal Grandmother   . Diabetes Maternal Grandfather   . Diabetes Paternal Grandmother   . Diabetes Paternal Grandfather     Social History Social History  Substance Use Topics  . Smoking status: Former Smoker    Packs/day: 1.50    Years: 27.00    Types: Cigarettes    Quit  date: 04/03/2004  . Smokeless tobacco: Never Used  . Alcohol use No    Allergies   Morphine and related   Review of Systems Review of Systems  Constitutional: Negative for fever.  Musculoskeletal: Positive for neck pain and neck stiffness. Negative for myalgias.  Neurological: Negative for weakness and numbness.   Physical Exam Updated Vital Signs There were no vitals taken for this visit.  Physical Exam  Constitutional: She is oriented to person, place, and time. She appears well-developed and well-nourished. No distress.  HENT:  Head: Normocephalic and atraumatic.  Eyes: Conjunctivae are normal.  Neck:  Tenderness to  palpation over bilateral trapezius extending from the base of the skull into the shoulders and bilateral sternocleidomastoid. Pain with any range of motion of the neck. No midline tenderness. Patient unable to move her head in any direction. There is obvious spasms in bilateral trapezius muscles.  Cardiovascular: Normal rate.   Pulmonary/Chest: Effort normal.  Abdominal: She exhibits no distension.  Musculoskeletal:  Full range of motion bilateral upper extremities. 5 out of 5 and equal strength bilateral grips, biceps, triceps, deltoids. Sensation intact in both extremities. Radial pulses are intact and equal bilaterally.  Neurological: She is alert and oriented to person, place, and time.  Skin: Skin is warm and dry. Capillary refill takes less than 2 seconds.  Psychiatric: She has a normal mood and affect.  Nursing note and vitals reviewed.  ED Treatments / Results  DIAGNOSTIC STUDIES:  Oxygen Saturation is 99% on RA, normal by my interpretation.    COORDINATION OF CARE:  1:16PM Discussed treatment plan with pt at bedside and pt agreed to plan.   Labs (all labs ordered are listed, but only abnormal results are displayed) Labs Reviewed  I-STAT CHEM 8, ED - Abnormal; Notable for the following:       Result Value   Chloride 99 (*)    Glucose, Bld 110 (*)    All other components within normal limits    EKG  EKG Interpretation None      Radiology Dg Cervical Spine Complete  Result Date: 03/27/2016 CLINICAL DATA:  Severe posterior neck pain since yesterday. EXAM: CERVICAL SPINE - COMPLETE 4+ VIEW COMPARISON:  CT cervical spine 11/16/2012. FINDINGS: Vertebral body height and alignment are maintained. Marked loss of disc space height is seen at C5-6. Mid and lower cervical and spine facet arthropathy is also identified. Prevertebral soft tissues appear normal. Lung apices are clear. IMPRESSION: No acute abnormality. Degenerative disc disease C5-6. Electronically Signed   By: Drusilla Kanner M.D.   On: 03/27/2016 13:51    Procedures Procedures (including critical care time)  Medications Ordered in ED Medications - No data to display   Initial Impression / Assessment and Plan / ED Course  I have reviewed the triage vital signs and the nursing notes.  Pertinent labs & imaging results that were available during my care of the patient were reviewed by me and considered in my medical decision making (see chart for details).  Clinical Course     Patient with spasms to bilateral neck. There is no midline tenderness. She does have known C5-C6 degenerative disc, and she is worried that this is getting worse. She had a normal virus last week and has been vomiting. She denies any injuries to the neck. There is no pain, numbness, weakness in bilateral extremities. Exam is unremarkable other than spasms and tenderness over bilateral neck muscles. We will get an x-ray, check electrolytes and renal function for  dehydration and electrolyte abnormality.   Chloride slightly low, otherwise normal electrolytes. X-ray showing C5-C6 degenerative disc disease otherwise negative. Patient received 5 mg of Valium IM and Percocet. She is not feeling much better. She feels better when I'm holding her head in my hands, and worse when she tries to move it. She felt some better after I massaged her neck for few min. I do not think pt needs futher evaluation. I am not concerned about any vascular injury or abnormality. She has no neurological symptoms, doubt cord compression.  Discussed with Dr. Rojelio BrennerStinl, as long as no neuro deficits, appropriate for discharge home with pain management and follow-up with family doctor.  Vitals:   03/27/16 1305 03/27/16 1508  BP: 127/80 118/94  Pulse: 86 80  Resp: 20 20  Temp: 98.6 F (37 C)   TempSrc: Oral   SpO2: 99% 98%     Final Clinical Impressions(s) / ED Diagnoses   Final diagnoses:  Torticollis    New Prescriptions Discharge Medication List as of  03/27/2016  3:06 PM    START taking these medications   Details  oxyCODONE-acetaminophen (PERCOCET) 5-325 MG tablet Take 1-2 tablets by mouth every 4 (four) hours as needed for severe pain., Starting Tue 03/27/2016, Print        I personally performed the services described in this documentation, which was scribed in my presence. The recorded information has been reviewed and is accurate.    Jaynie Crumbleatyana Kaylyne Axton, PA-C 03/27/16 1625    Cathren LaineKevin Steinl, MD 03/27/16 [phone]

## 2016-05-16 ENCOUNTER — Other Ambulatory Visit: Payer: Self-pay | Admitting: Gynecology

## 2016-05-16 DIAGNOSIS — Z1231 Encounter for screening mammogram for malignant neoplasm of breast: Secondary | ICD-10-CM

## 2016-06-05 ENCOUNTER — Ambulatory Visit
Admission: RE | Admit: 2016-06-05 | Discharge: 2016-06-05 | Disposition: A | Discharge status: Home / Self Care | Payer: BLUE CROSS/BLUE SHIELD | Source: Ambulatory Visit | Attending: Gynecology | Admitting: Gynecology

## 2016-06-05 DIAGNOSIS — Z1231 Encounter for screening mammogram for malignant neoplasm of breast: Secondary | ICD-10-CM

## 2016-11-12 ENCOUNTER — Ambulatory Visit (INDEPENDENT_AMBULATORY_CARE_PROVIDER_SITE_OTHER): Payer: BLUE CROSS/BLUE SHIELD | Admitting: Gynecology

## 2016-11-12 ENCOUNTER — Encounter: Payer: Self-pay | Admitting: Gynecology

## 2016-11-12 VITALS — BP 118/72 | Ht 68.0 in | Wt 138.0 lb

## 2016-11-12 DIAGNOSIS — Z113 Encounter for screening for infections with a predominantly sexual mode of transmission: Secondary | ICD-10-CM

## 2016-11-12 DIAGNOSIS — Z13220 Encounter for screening for lipoid disorders: Secondary | ICD-10-CM | POA: Diagnosis not present

## 2016-11-12 DIAGNOSIS — Z01411 Encounter for gynecological examination (general) (routine) with abnormal findings: Secondary | ICD-10-CM | POA: Diagnosis not present

## 2016-11-12 LAB — HEPATITIS B SURFACE ANTIGEN: HEP B S AG: NONREACTIVE

## 2016-11-12 LAB — CBC WITH DIFFERENTIAL/PLATELET
BASOS PCT: 0 %
Basophils Absolute: 0 cells/uL (ref 0–200)
EOS ABS: 117 {cells}/uL (ref 15–500)
Eosinophils Relative: 3 %
HEMATOCRIT: 37.5 % (ref 35.0–45.0)
HEMOGLOBIN: 12.6 g/dL (ref 11.7–15.5)
LYMPHS ABS: 1599 {cells}/uL (ref 850–3900)
LYMPHS PCT: 41 %
MCH: 31.8 pg (ref 27.0–33.0)
MCHC: 33.6 g/dL (ref 32.0–36.0)
MCV: 94.7 fL (ref 80.0–100.0)
MONO ABS: 390 {cells}/uL (ref 200–950)
MPV: 9.1 fL (ref 7.5–12.5)
Monocytes Relative: 10 %
Neutro Abs: 1794 cells/uL (ref 1500–7800)
Neutrophils Relative %: 46 %
Platelets: 270 10*3/uL (ref 140–400)
RBC: 3.96 MIL/uL (ref 3.80–5.10)
RDW: 12.8 % (ref 11.0–15.0)
WBC: 3.9 10*3/uL (ref 3.8–10.8)

## 2016-11-12 LAB — COMPREHENSIVE METABOLIC PANEL
ALK PHOS: 61 U/L (ref 33–130)
ALT: 18 U/L (ref 6–29)
AST: 19 U/L (ref 10–35)
Albumin: 4.4 g/dL (ref 3.6–5.1)
BILIRUBIN TOTAL: 0.5 mg/dL (ref 0.2–1.2)
BUN: 13 mg/dL (ref 7–25)
CALCIUM: 9.7 mg/dL (ref 8.6–10.4)
CO2: 23 mmol/L (ref 20–32)
Chloride: 101 mmol/L (ref 98–110)
Creat: 0.75 mg/dL (ref 0.50–1.05)
Glucose, Bld: 87 mg/dL (ref 65–99)
POTASSIUM: 4 mmol/L (ref 3.5–5.3)
Sodium: 137 mmol/L (ref 135–146)
Total Protein: 6.8 g/dL (ref 6.1–8.1)

## 2016-11-12 LAB — HIV ANTIBODY (ROUTINE TESTING W REFLEX): HIV 1&2 Ab, 4th Generation: NONREACTIVE

## 2016-11-12 LAB — HEPATITIS C ANTIBODY: HCV AB: NONREACTIVE

## 2016-11-12 NOTE — Progress Notes (Signed)
    [facility] 01/20/1961 [id number]        56 y.o.  G2P0020 for annual exam.  Requests STD screening. Had unprotected intercourse a number of months ago 1. No symptoms such as discharge odor or irritation.  Past medical history,surgical history, problem list, medications, allergies, family history and social history were all reviewed and documented as reviewed in the EPIC chart.  ROS:  Performed with pertinent positives and negatives included in the history, assessment and plan.   Additional significant findings :  None   Exam: Kennon PortelaKim Gardner assistant Vitals:   11/12/16 0828  BP: 118/72  Weight: 138 lb (62.6 kg)  Height: 5\' 8"  (1.727 m)   Body mass index is 20.98 kg/m.  General appearance:  Normal affect, orientation and appearance. Skin: Grossly normal HEENT: Without gross lesions.  No cervical or supraclavicular adenopathy. Thyroid normal.  Lungs:  Clear without wheezing, rales or rhonchi Cardiac: RR, without RMG Abdominal:  Soft, nontender, without masses, guarding, rebound, organomegaly or hernia Breasts:  Examined lying and sitting without masses, retractions, discharge or axillary adenopathy. Pelvic:  Ext, BUS, Vagina: With mild atrophic changes  Cervix: With mild atrophic changes. GC/committee screen done  Uterus: Anteverted, normal size, shape and contour, midline and mobile nontender   Adnexa: Without masses or tenderness    Anus and perineum: Normal   Rectovaginal: Normal sphincter tone without palpated masses or tenderness.    Assessment/Plan:  56 y.o. 262P0020 female for annual exam.   1. Postmenopausal/atrophic genital changes. No significant hot flushes, night sweats, vaginal dryness or any vaginal bleeding. Continue monitor and report any issues or bleeding. 2. STD screening. Patient requests STD screening secondary to unprotected intercourse 1 a number of months ago. GC/chlamydia, hepatitis B, hepatitis C, RPR, HIV ordered. 3. Pap smear/HPV 2015 negative.  No Pap smear done today. No history of abnormal Pap smears previously. Plan repeat Pap smear at 5 year interval per current screening guidelines. 4. Mammography 05/2016. Continue with annual mammography when due. Breast exam normal today. Self breast awareness. 5. Colonoscopy 2013. Repeat at their recommended interval. 6. DEXA never. Will plan further into the menopause. Actively exercises area 7. Health maintenance. Baseline CBC, CMP, STD screening labs as above ordered. Lipid profile last your excellent accepting borderline HDL which she reports is always borderline despite her active exercise. Follow up in one year, sooner as needed.   Dara LordsFONTAINE,Avey Mcmanamon P MD, 8:51 AM 11/12/2016

## 2016-11-12 NOTE — Patient Instructions (Signed)
Followup in one year for annual exam, sooner if any issues 

## 2016-11-13 ENCOUNTER — Encounter: Payer: Self-pay | Admitting: Gynecology

## 2016-11-13 LAB — RPR

## 2016-11-13 LAB — GC/CHLAMYDIA PROBE AMP
CT Probe RNA: NOT DETECTED
GC Probe RNA: NOT DETECTED

## 2017-02-12 DIAGNOSIS — H269 Unspecified cataract: Secondary | ICD-10-CM | POA: Diagnosis not present

## 2017-02-12 DIAGNOSIS — Z833 Family history of diabetes mellitus: Secondary | ICD-10-CM | POA: Diagnosis not present

## 2017-02-12 DIAGNOSIS — Z131 Encounter for screening for diabetes mellitus: Secondary | ICD-10-CM | POA: Diagnosis not present

## 2017-03-30 DIAGNOSIS — S61250A Open bite of right index finger without damage to nail, initial encounter: Secondary | ICD-10-CM | POA: Diagnosis not present

## 2017-05-06 DIAGNOSIS — H25011 Cortical age-related cataract, right eye: Secondary | ICD-10-CM | POA: Diagnosis not present

## 2017-05-14 DIAGNOSIS — M25561 Pain in right knee: Secondary | ICD-10-CM | POA: Diagnosis not present

## 2017-05-16 DIAGNOSIS — M2391 Unspecified internal derangement of right knee: Secondary | ICD-10-CM | POA: Diagnosis not present

## 2017-05-16 DIAGNOSIS — M11269 Other chondrocalcinosis, unspecified knee: Secondary | ICD-10-CM | POA: Diagnosis not present

## 2017-05-20 ENCOUNTER — Other Ambulatory Visit: Payer: Self-pay | Admitting: Gynecology

## 2017-05-20 DIAGNOSIS — Z1231 Encounter for screening mammogram for malignant neoplasm of breast: Secondary | ICD-10-CM

## 2017-05-24 DIAGNOSIS — M11261 Other chondrocalcinosis, right knee: Secondary | ICD-10-CM | POA: Diagnosis not present

## 2017-06-06 ENCOUNTER — Ambulatory Visit
Admission: RE | Admit: 2017-06-06 | Discharge: 2017-06-06 | Disposition: A | Discharge status: Home / Self Care | Payer: BLUE CROSS/BLUE SHIELD | Source: Ambulatory Visit | Attending: Gynecology | Admitting: Gynecology

## 2017-06-06 DIAGNOSIS — Z1231 Encounter for screening mammogram for malignant neoplasm of breast: Secondary | ICD-10-CM

## 2017-08-08 DIAGNOSIS — Z86018 Personal history of other benign neoplasm: Secondary | ICD-10-CM | POA: Diagnosis not present

## 2017-08-08 DIAGNOSIS — L814 Other melanin hyperpigmentation: Secondary | ICD-10-CM | POA: Diagnosis not present

## 2017-08-08 DIAGNOSIS — D1801 Hemangioma of skin and subcutaneous tissue: Secondary | ICD-10-CM | POA: Diagnosis not present

## 2017-08-08 DIAGNOSIS — L821 Other seborrheic keratosis: Secondary | ICD-10-CM | POA: Diagnosis not present

## 2017-08-08 DIAGNOSIS — L570 Actinic keratosis: Secondary | ICD-10-CM | POA: Diagnosis not present

## 2017-08-21 DIAGNOSIS — D171 Benign lipomatous neoplasm of skin and subcutaneous tissue of trunk: Secondary | ICD-10-CM | POA: Diagnosis not present

## 2017-10-14 ENCOUNTER — Ambulatory Visit (INDEPENDENT_AMBULATORY_CARE_PROVIDER_SITE_OTHER): Payer: BLUE CROSS/BLUE SHIELD | Admitting: Gynecology

## 2017-10-14 ENCOUNTER — Encounter: Payer: Self-pay | Admitting: Gynecology

## 2017-10-14 VITALS — BP 122/74

## 2017-10-14 DIAGNOSIS — N3000 Acute cystitis without hematuria: Secondary | ICD-10-CM

## 2017-10-14 DIAGNOSIS — R300 Dysuria: Secondary | ICD-10-CM

## 2017-10-14 MED ORDER — SULFAMETHOXAZOLE-TRIMETHOPRIM 800-160 MG PO TABS: 1.0000 | ORAL_TABLET | Freq: Two times a day (BID) | ORAL | 0 refills | Status: DC

## 2017-10-14 NOTE — Patient Instructions (Signed)
Take the antibiotic twice daily for 3 days.  Follow-up if your symptoms persist, worsen or recur. 

## 2017-10-14 NOTE — Progress Notes (Signed)
    [facility] 07/20/1960 [id number]        57 y.o.  G2P0020 presents with 1 to 2 days of urinary frequency and dysuria.  Some mild urgency.  No low back pain fever or chills.  No nausea vomiting diarrhea constipation.  Past medical history,surgical history, problem list, medications, allergies, family history and social history were all reviewed and documented in the EPIC chart.  Directed ROS with pertinent positives and negatives documented in the history of present illness/assessment and plan.  Exam: Vitals:   10/14/17 0934  BP: 122/74   General appearance:  Normal Spine straight without CVA tenderness Abdomen soft nontender without masses guarding rebound  Assessment/Plan:  57 y.o. G2P0020 with history and urine analysis consistent with early UTI.  Will treat with Septra DS 1 p.o. twice daily x3 days.  Patient started AZO OTC yesterday and will continue this for another day or so.  She will follow-up if her symptoms persist, worsen or recur.  Patient is due for her annual and has it scheduled next month and will follow-up at that time.      Dara Lordsimothy P Elsy Chiang MD, 9:51 AM 10/14/2017

## 2017-10-16 LAB — URINE CULTURE
MICRO NUMBER:: 90840241
SPECIMEN QUALITY:: ADEQUATE

## 2017-10-16 LAB — URINALYSIS, COMPLETE W/RFL CULTURE
BILIRUBIN URINE: NEGATIVE
HGB URINE DIPSTICK: NEGATIVE
HYALINE CAST: NONE SEEN /LPF
Ketones, ur: NEGATIVE
Nitrites, Initial: POSITIVE — AB
PH: 5.5 (ref 5.0–8.0)
Protein, ur: NEGATIVE
RBC / HPF: NONE SEEN /HPF (ref 0–2)
Specific Gravity, Urine: 1.004 (ref 1.001–1.03)

## 2017-10-16 LAB — CULTURE INDICATED

## 2017-10-24 ENCOUNTER — Ambulatory Visit (INDEPENDENT_AMBULATORY_CARE_PROVIDER_SITE_OTHER): Payer: BLUE CROSS/BLUE SHIELD | Admitting: Gynecology

## 2017-10-24 ENCOUNTER — Other Ambulatory Visit: Payer: Self-pay | Admitting: Gynecology

## 2017-10-24 ENCOUNTER — Encounter: Payer: Self-pay | Admitting: Gynecology

## 2017-10-24 VITALS — BP 118/76

## 2017-10-24 DIAGNOSIS — J0110 Acute frontal sinusitis, unspecified: Secondary | ICD-10-CM

## 2017-10-24 DIAGNOSIS — Z13220 Encounter for screening for lipoid disorders: Secondary | ICD-10-CM

## 2017-10-24 DIAGNOSIS — Z113 Encounter for screening for infections with a predominantly sexual mode of transmission: Secondary | ICD-10-CM

## 2017-10-24 DIAGNOSIS — R300 Dysuria: Secondary | ICD-10-CM

## 2017-10-24 DIAGNOSIS — N3000 Acute cystitis without hematuria: Secondary | ICD-10-CM | POA: Diagnosis not present

## 2017-10-24 DIAGNOSIS — Z01419 Encounter for gynecological examination (general) (routine) without abnormal findings: Secondary | ICD-10-CM

## 2017-10-24 MED ORDER — CIPROFLOXACIN HCL 250 MG PO TABS: 250.0000 mg | ORAL_TABLET | Freq: Two times a day (BID) | ORAL | 0 refills | Status: DC

## 2017-10-24 NOTE — Progress Notes (Signed)
    [facility] 05/28/1960 [id number]        57 y.o.  G2P0020 resents complaining of:  1. Recurrent UTI.  Recently was treated for UTI with Septra DS.  The culture grew out E. coli sensitive to this.  Her symptoms totally resolved but now have recurred starting yesterday with dysuria frequency and urgency.  No low back pain fever or chills.  No nausea vomiting diarrhea constipation.  No vaginal symptoms such as discharge irritation or odor. 2. Also complaining of sinusitis.  History of URI several weeks ago.  Has had persistent drainage now with green mucus and periorbital discomfort.  No fever or chills.  No cough.  Past medical history,surgical history, problem list, medications, allergies, family history and social history were all reviewed and documented in the EPIC chart.  Directed ROS with pertinent positives and negatives documented in the history of present illness/assessment and plan.  Exam: Vitals:   10/24/17 0928  BP: 118/76   General appearance:  Normal HEENT is normal excepting mild periorbital tenderness.  No cervical adenopathy or pharyngitis. Lungs clear bilaterally. Cardiac regular rate no rubs murmurs or gallops Spine straight without CVA tenderness Abdomen soft nontender without masses guarding rebound  Assessment/Plan:  57 y.o. G2P0020 with:  1. UTI by history and urine analysis.  Will treat with ciprofloxacin 250 mg twice daily x7 days.  Will give a more extended course given the quick recurrence of the UTI on a 3-day course.  The E. coli from her previous UTI is sensitive to ciprofloxacin.  She will follow-up if her symptoms persist, worsen or recur. 2. Sinusitis by history/exam.  Ciprofloxacin as prescribed above hopefully will cover this also.  She will follow-up if her symptoms persist, worsen or recur.    Dara Lordsimothy P Delora Gravatt MD, 9:49 AM 10/24/2017

## 2017-10-24 NOTE — Patient Instructions (Signed)
Take the ciprofloxacin antibiotic twice daily for 7 days.  Follow-up if your symptoms persist, worsen or recur. 

## 2017-10-27 LAB — URINALYSIS, COMPLETE W/RFL CULTURE
BILIRUBIN URINE: NEGATIVE
Glucose, UA: NEGATIVE
HYALINE CAST: NONE SEEN /LPF
Hgb urine dipstick: NEGATIVE
Ketones, ur: NEGATIVE
Nitrites, Initial: POSITIVE — AB
PROTEIN: NEGATIVE
RBC / HPF: NONE SEEN /HPF (ref 0–2)
SPECIFIC GRAVITY, URINE: 1.01 (ref 1.001–1.03)
pH: 7 (ref 5.0–8.0)

## 2017-10-27 LAB — URINE CULTURE
MICRO NUMBER:: 90886243
SPECIMEN QUALITY: ADEQUATE

## 2017-10-27 LAB — CULTURE INDICATED

## 2017-11-13 ENCOUNTER — Encounter: Payer: BLUE CROSS/BLUE SHIELD | Admitting: Gynecology

## 2017-11-18 ENCOUNTER — Other Ambulatory Visit: Payer: BLUE CROSS/BLUE SHIELD

## 2017-11-18 DIAGNOSIS — Z01419 Encounter for gynecological examination (general) (routine) without abnormal findings: Secondary | ICD-10-CM

## 2017-11-18 DIAGNOSIS — Z13220 Encounter for screening for lipoid disorders: Secondary | ICD-10-CM

## 2017-11-18 DIAGNOSIS — Z113 Encounter for screening for infections with a predominantly sexual mode of transmission: Secondary | ICD-10-CM

## 2017-11-19 LAB — LIPID PANEL
CHOL/HDL RATIO: 3.5 (calc) (ref <5.0)
CHOLESTEROL: 135 mg/dL (ref <200)
HDL: 39 mg/dL — AB (ref >50)
LDL CHOLESTEROL (CALC): 85 mg/dL
Non-HDL Cholesterol (Calc): 96 mg/dL (calc) (ref <130)
TRIGLYCERIDES: 37 mg/dL (ref <150)

## 2017-11-19 LAB — CBC WITH DIFFERENTIAL/PLATELET
Basophils Absolute: 29 cells/uL (ref 0–200)
Basophils Relative: 0.6 %
EOS PCT: 2.9 %
Eosinophils Absolute: 139 cells/uL (ref 15–500)
HCT: 36.8 % (ref 35.0–45.0)
Hemoglobin: 12.7 g/dL (ref 11.7–15.5)
LYMPHS ABS: 1373 {cells}/uL (ref 850–3900)
MCH: 32.9 pg (ref 27.0–33.0)
MCHC: 34.5 g/dL (ref 32.0–36.0)
MCV: 95.3 fL (ref 80.0–100.0)
MPV: 9.7 fL (ref 7.5–12.5)
Monocytes Relative: 10 %
Neutro Abs: 2779 cells/uL (ref 1500–7800)
Neutrophils Relative %: 57.9 %
PLATELETS: 273 10*3/uL (ref 140–400)
RBC: 3.86 10*6/uL (ref 3.80–5.10)
RDW: 11.8 % (ref 11.0–15.0)
TOTAL LYMPHOCYTE: 28.6 %
WBC mixed population: 480 cells/uL (ref 200–950)
WBC: 4.8 10*3/uL (ref 3.8–10.8)

## 2017-11-19 LAB — COMPREHENSIVE METABOLIC PANEL
AG Ratio: 1.7 (calc) (ref 1.0–2.5)
ALBUMIN MSPROF: 4.1 g/dL (ref 3.6–5.1)
ALKALINE PHOSPHATASE (APISO): 55 U/L (ref 33–130)
ALT: 16 U/L (ref 6–29)
AST: 19 U/L (ref 10–35)
BILIRUBIN TOTAL: 0.5 mg/dL (ref 0.2–1.2)
BUN: 12 mg/dL (ref 7–25)
CALCIUM: 9.5 mg/dL (ref 8.6–10.4)
CO2: 27 mmol/L (ref 20–32)
Chloride: 105 mmol/L (ref 98–110)
Creat: 0.74 mg/dL (ref 0.50–1.05)
Globulin: 2.4 g/dL (calc) (ref 1.9–3.7)
Glucose, Bld: 93 mg/dL (ref 65–99)
Potassium: 4.2 mmol/L (ref 3.5–5.3)
Sodium: 138 mmol/L (ref 135–146)
Total Protein: 6.5 g/dL (ref 6.1–8.1)

## 2017-11-19 LAB — HEPATITIS B SURFACE ANTIGEN: Hepatitis B Surface Ag: NONREACTIVE

## 2017-11-19 LAB — HIV ANTIBODY (ROUTINE TESTING W REFLEX): HIV 1&2 Ab, 4th Generation: NONREACTIVE

## 2017-11-19 LAB — RPR: RPR Ser Ql: NONREACTIVE

## 2017-11-19 LAB — HEPATITIS C ANTIBODY
Hepatitis C Ab: NONREACTIVE
SIGNAL TO CUT-OFF: 0.01 (ref <1.00)

## 2017-11-27 ENCOUNTER — Ambulatory Visit (INDEPENDENT_AMBULATORY_CARE_PROVIDER_SITE_OTHER): Payer: BLUE CROSS/BLUE SHIELD | Admitting: Gynecology

## 2017-11-27 ENCOUNTER — Encounter: Payer: Self-pay | Admitting: Gynecology

## 2017-11-27 VITALS — BP 120/74 | Ht 67.0 in | Wt 143.0 lb

## 2017-11-27 DIAGNOSIS — Z01419 Encounter for gynecological examination (general) (routine) without abnormal findings: Secondary | ICD-10-CM

## 2017-11-27 DIAGNOSIS — Z113 Encounter for screening for infections with a predominantly sexual mode of transmission: Secondary | ICD-10-CM | POA: Diagnosis not present

## 2017-11-27 DIAGNOSIS — Z1151 Encounter for screening for human papillomavirus (HPV): Secondary | ICD-10-CM

## 2017-11-27 NOTE — Patient Instructions (Signed)
Follow-up in 1 year for annual exam, sooner as needed. 

## 2017-11-27 NOTE — Progress Notes (Signed)
    [facility] 11/08/1960 [id number]        57 y.o.  G2P0020 for annual gynecologic exam.  Doing well without complaints.  Past medical history,surgical history, problem list, medications, allergies, family history and social history were all reviewed and documented as reviewed in the EPIC chart.  ROS:  Performed with pertinent positives and negatives included in the history, assessment and plan.   Additional significant findings : None   Exam: Kennon PortelaKim Gardner assistant Vitals:   11/27/17 0831  BP: 120/74  Weight: 143 lb (64.9 kg)  Height: 5\' 7"  (1.702 m)   Body mass index is 22.4 kg/m.  General appearance:  Normal affect, orientation and appearance. Skin: Grossly normal HEENT: Without gross lesions.  No cervical or supraclavicular adenopathy. Thyroid normal.  Lungs:  Clear without wheezing, rales or rhonchi Cardiac: RR, without RMG Abdominal:  Soft, nontender, without masses, guarding, rebound, organomegaly or hernia Breasts:  Examined lying and sitting without masses, retractions, discharge or axillary adenopathy. Pelvic:  Ext, BUS, Vagina: Normal with mild atrophic changes  Cervix: Normal with mild atrophic changes.  Pap smear/HPV, GC/Chlamydia  Uterus: Anteverted, normal size, shape and contour, midline and mobile nontender   Adnexa: Without masses or tenderness    Anus and perineum: Normal   Rectovaginal: Normal sphincter tone without palpated masses or tenderness.    Assessment/Plan:  57 y.o. 12P0020 female for annual gynecologic exam.   1. Postmenopausal.  Doing well without significant menopausal symptoms or any bleeding. 2. STD screening requested.  No known exposure.  Had blood drawn before visit and had negative HIV RPR hepatitis B and hepatitis C.  GC/Chlamydia screen done today. 3. Pap smear/HPV 05/2013.  Pap smear/HPV today.  No history of abnormal Pap smears previously. 4. Mammography 05/2017.  Continue with annual mammography when due.  Breast exam normal  today. 5. Colonoscopy 2013.  Repeat at their recommended interval. 6. Health maintenance.  Reviewed recent lab work with her.  Low HDL discussed recommending exercise on a regular basis.  Total cholesterol and LDL excellent.  Follow-up in 1 year, sooner as needed.   Dara Lordsimothy P Mekaylah Klich MD, 8:56 AM 11/27/2017

## 2017-11-27 NOTE — Addendum Note (Signed)
Addended by: Dayna BarkerGARDNER, Lacosta Hargan K on: 11/27/2017 09:53 AM   Modules accepted: Orders

## 2017-11-28 LAB — C. TRACHOMATIS/N. GONORRHOEAE RNA
C. trachomatis RNA, TMA: NOT DETECTED
N. GONORRHOEAE RNA, TMA: NOT DETECTED

## 2017-11-29 LAB — PAP IG AND HPV HIGH-RISK: HPV DNA HIGH RISK: NOT DETECTED

## 2017-12-23 ENCOUNTER — Encounter: Payer: Self-pay | Admitting: Gynecology

## 2017-12-23 ENCOUNTER — Ambulatory Visit (INDEPENDENT_AMBULATORY_CARE_PROVIDER_SITE_OTHER): Payer: BLUE CROSS/BLUE SHIELD | Admitting: Gynecology

## 2017-12-23 VITALS — BP 122/78

## 2017-12-23 DIAGNOSIS — N760 Acute vaginitis: Secondary | ICD-10-CM | POA: Diagnosis not present

## 2017-12-23 LAB — WET PREP FOR TRICH, YEAST, CLUE

## 2017-12-23 MED ORDER — FLUCONAZOLE 150 MG PO TABS: 150.0000 mg | ORAL_TABLET | Freq: Once | ORAL | 0 refills | Status: AC

## 2017-12-23 NOTE — Patient Instructions (Signed)
Take the one Diflucan pill.  Repeat it in several days if your symptoms persist.  Call if you have persistence of your symptoms despite the Diflucan.

## 2017-12-23 NOTE — Addendum Note (Signed)
Addended by: Dayna BarkerGARDNER, Ryna Beckstrom K on: 12/23/2017 12:03 PM   Modules accepted: Orders

## 2017-12-23 NOTE — Progress Notes (Signed)
    [facility] 07/04/1960 [id number]        57 y.o.  G2P0020 presents with 2-day history of vaginal itching and discharge.  No odor or significant irritation.  No urinary symptoms such as frequency dysuria urgency low back pain.  Has been exercising a lot and stayed in biking pants for 7 hours over the weekend.  Started OTC cream last night.  Past medical history,surgical history, problem list, medications, allergies, family history and social history were all reviewed and documented in the EPIC chart.  Directed ROS with pertinent positives and negatives documented in the history of present illness/assessment and plan.  Exam: Kennon PortelaKim Gardner assistant Vitals:   12/23/17 1027  BP: 122/78   General appearance:  Normal Abdomen soft nontender without masses guarding rebound Pelvic external BUS vagina with slight white discharge.  Cervix normal.  Uterus normal size midline mobile nontender.  Adnexa without masses or tenderness.  Assessment/Plan:  57 y.o. G2P0020 with history suggestive of yeast infection.  Wet prep showed cream but no infections.  Discussed with patient I think historically certainly fits with yeast.  We will go ahead and cover with Diflucan 150 mg x 1 dose.  Extra pill provided in the event she persists in symptoms she will take the second dose.  If it does not totally clear she will call.    Dara Lordsimothy P Bessie Livingood MD, 10:39 AM 12/23/2017

## 2018-01-08 DIAGNOSIS — S70311A Abrasion, right thigh, initial encounter: Secondary | ICD-10-CM | POA: Diagnosis not present

## 2018-01-13 DIAGNOSIS — S70311D Abrasion, right thigh, subsequent encounter: Secondary | ICD-10-CM | POA: Diagnosis not present

## 2018-01-23 DIAGNOSIS — F331 Major depressive disorder, recurrent, moderate: Secondary | ICD-10-CM | POA: Diagnosis not present

## 2018-01-23 DIAGNOSIS — F411 Generalized anxiety disorder: Secondary | ICD-10-CM | POA: Diagnosis not present

## 2018-01-23 DIAGNOSIS — F902 Attention-deficit hyperactivity disorder, combined type: Secondary | ICD-10-CM | POA: Diagnosis not present

## 2018-03-06 DIAGNOSIS — F3342 Major depressive disorder, recurrent, in full remission: Secondary | ICD-10-CM | POA: Diagnosis not present

## 2018-03-06 DIAGNOSIS — F411 Generalized anxiety disorder: Secondary | ICD-10-CM | POA: Diagnosis not present

## 2018-03-06 DIAGNOSIS — F902 Attention-deficit hyperactivity disorder, combined type: Secondary | ICD-10-CM | POA: Diagnosis not present

## 2018-03-20 DIAGNOSIS — H25011 Cortical age-related cataract, right eye: Secondary | ICD-10-CM | POA: Diagnosis not present

## 2018-03-20 DIAGNOSIS — H5310 Unspecified subjective visual disturbances: Secondary | ICD-10-CM | POA: Diagnosis not present

## 2018-05-12 ENCOUNTER — Other Ambulatory Visit: Payer: Self-pay | Admitting: Gynecology

## 2018-05-12 DIAGNOSIS — Z1231 Encounter for screening mammogram for malignant neoplasm of breast: Secondary | ICD-10-CM

## 2018-06-12 ENCOUNTER — Ambulatory Visit: Payer: BLUE CROSS/BLUE SHIELD

## 2018-07-10 ENCOUNTER — Ambulatory Visit: Payer: BLUE CROSS/BLUE SHIELD

## 2018-08-18 ENCOUNTER — Ambulatory Visit: Payer: BLUE CROSS/BLUE SHIELD

## 2018-09-08 ENCOUNTER — Telehealth: Payer: Self-pay | Admitting: *Deleted

## 2018-09-08 DIAGNOSIS — Z1321 Encounter for screening for nutritional disorder: Secondary | ICD-10-CM

## 2018-09-08 DIAGNOSIS — Z1329 Encounter for screening for other suspected endocrine disorder: Secondary | ICD-10-CM

## 2018-09-08 DIAGNOSIS — Z01419 Encounter for gynecological examination (general) (routine) without abnormal findings: Secondary | ICD-10-CM

## 2018-09-08 DIAGNOSIS — Z13220 Encounter for screening for lipoid disorders: Secondary | ICD-10-CM

## 2018-09-08 DIAGNOSIS — Z113 Encounter for screening for infections with a predominantly sexual mode of transmission: Secondary | ICD-10-CM

## 2018-09-08 NOTE — Telephone Encounter (Signed)
Order placed, patient coming on 11/24/18 @ 8:45am

## 2018-09-08 NOTE — Telephone Encounter (Signed)
CBC, comprehensive metabolic panel, lipid profile, TSH.  She had STD screening last year and if she wants to also have STD blood work then HIV, RPR, hepatitis B and hepatitis C screen

## 2018-09-08 NOTE — Telephone Encounter (Signed)
Patient has annual exam scheduled on 12/01/18 would like to have labs done prior to annual exam. Please advise

## 2018-09-26 ENCOUNTER — Ambulatory Visit
Admission: RE | Admit: 2018-09-26 | Discharge: 2018-09-26 | Disposition: A | Discharge status: Home / Self Care | Payer: BC Managed Care – PPO | Source: Ambulatory Visit | Attending: Gynecology | Admitting: Gynecology

## 2018-09-26 DIAGNOSIS — Z1231 Encounter for screening mammogram for malignant neoplasm of breast: Secondary | ICD-10-CM

## 2018-09-30 DIAGNOSIS — D225 Melanocytic nevi of trunk: Secondary | ICD-10-CM | POA: Diagnosis not present

## 2018-09-30 DIAGNOSIS — Z411 Encounter for cosmetic surgery: Secondary | ICD-10-CM | POA: Diagnosis not present

## 2018-09-30 DIAGNOSIS — D2262 Melanocytic nevi of left upper limb, including shoulder: Secondary | ICD-10-CM | POA: Diagnosis not present

## 2018-09-30 DIAGNOSIS — D485 Neoplasm of uncertain behavior of skin: Secondary | ICD-10-CM | POA: Diagnosis not present

## 2018-09-30 DIAGNOSIS — B079 Viral wart, unspecified: Secondary | ICD-10-CM | POA: Diagnosis not present

## 2018-10-18 DIAGNOSIS — S81012A Laceration without foreign body, left knee, initial encounter: Secondary | ICD-10-CM | POA: Diagnosis not present

## 2018-10-18 DIAGNOSIS — S63502A Unspecified sprain of left wrist, initial encounter: Secondary | ICD-10-CM | POA: Diagnosis not present

## 2018-10-18 DIAGNOSIS — S63621A Sprain of interphalangeal joint of right thumb, initial encounter: Secondary | ICD-10-CM | POA: Diagnosis not present

## 2018-10-18 DIAGNOSIS — S40012A Contusion of left shoulder, initial encounter: Secondary | ICD-10-CM | POA: Diagnosis not present

## 2018-10-18 DIAGNOSIS — S8002XA Contusion of left knee, initial encounter: Secondary | ICD-10-CM | POA: Diagnosis not present

## 2018-10-28 ENCOUNTER — Telehealth: Payer: Self-pay | Admitting: *Deleted

## 2018-10-28 DIAGNOSIS — Z01419 Encounter for gynecological examination (general) (routine) without abnormal findings: Secondary | ICD-10-CM

## 2018-10-28 NOTE — Telephone Encounter (Signed)
Comprehensive metabolic panel includes liver function tests.  Okay to add hemoglobin A1c

## 2018-10-28 NOTE — Telephone Encounter (Signed)
-  A1c added to labs °

## 2018-10-28 NOTE — Telephone Encounter (Signed)
Patient is coming for annual labs on 11/03/18 for "CBC, comprehensive metabolic panel, lipid profile, TSH.  She had STD screening last year and if she wants to also have STD blood work then HIV, RPR, hepatitis B and hepatitis C screen"  She called today asking if Liver function test and Hemoglobin A1C could be added to the above blood work? Patient did a finger blood test at work and glucose was elevated so her job suggested LFT and A1C be checked as well. Please advise

## 2018-10-31 ENCOUNTER — Other Ambulatory Visit: Payer: Self-pay

## 2018-11-03 ENCOUNTER — Other Ambulatory Visit: Payer: BC Managed Care – PPO

## 2018-11-03 ENCOUNTER — Other Ambulatory Visit: Payer: Self-pay

## 2018-11-03 DIAGNOSIS — Z113 Encounter for screening for infections with a predominantly sexual mode of transmission: Secondary | ICD-10-CM

## 2018-11-03 DIAGNOSIS — Z13220 Encounter for screening for lipoid disorders: Secondary | ICD-10-CM

## 2018-11-03 DIAGNOSIS — Z1329 Encounter for screening for other suspected endocrine disorder: Secondary | ICD-10-CM | POA: Diagnosis not present

## 2018-11-03 DIAGNOSIS — Z01419 Encounter for gynecological examination (general) (routine) without abnormal findings: Secondary | ICD-10-CM

## 2018-11-05 LAB — COMPREHENSIVE METABOLIC PANEL
AG Ratio: 1.8 (calc) (ref 1.0–2.5)
ALT: 15 U/L (ref 6–29)
AST: 18 U/L (ref 10–35)
Albumin: 4.3 g/dL (ref 3.6–5.1)
Alkaline phosphatase (APISO): 61 U/L (ref 37–153)
BUN: 11 mg/dL (ref 7–25)
CO2: 28 mmol/L (ref 20–32)
Calcium: 9.5 mg/dL (ref 8.6–10.4)
Chloride: 101 mmol/L (ref 98–110)
Creat: 0.67 mg/dL (ref 0.50–1.05)
Globulin: 2.4 g/dL (calc) (ref 1.9–3.7)
Glucose, Bld: 85 mg/dL (ref 65–99)
Potassium: 4.2 mmol/L (ref 3.5–5.3)
Sodium: 136 mmol/L (ref 135–146)
Total Bilirubin: 0.4 mg/dL (ref 0.2–1.2)
Total Protein: 6.7 g/dL (ref 6.1–8.1)

## 2018-11-05 LAB — LIPID PANEL
Cholesterol: 125 mg/dL (ref <200)
HDL: 38 mg/dL — ABNORMAL LOW (ref >=50)
LDL Cholesterol (Calc): 77 mg/dL (calc)
Non-HDL Cholesterol (Calc): 87 mg/dL (calc) (ref <130)
Total CHOL/HDL Ratio: 3.3 (calc) (ref <5.0)
Triglycerides: 36 mg/dL (ref <150)

## 2018-11-05 LAB — FLUORESCENT TREPONEMAL AB(FTA)-IGG-BLD: Fluorescent Treponemal ABS: NONREACTIVE

## 2018-11-05 LAB — CBC
HCT: 37.6 % (ref 35.0–45.0)
Hemoglobin: 12.8 g/dL (ref 11.7–15.5)
MCH: 33.2 pg — ABNORMAL HIGH (ref 27.0–33.0)
MCHC: 34 g/dL (ref 32.0–36.0)
MCV: 97.4 fL (ref 80.0–100.0)
MPV: 9.6 fL (ref 7.5–12.5)
Platelets: 300 10*3/uL (ref 140–400)
RBC: 3.86 10*6/uL (ref 3.80–5.10)
RDW: 11.4 % (ref 11.0–15.0)
WBC: 4 10*3/uL (ref 3.8–10.8)

## 2018-11-05 LAB — HEMOGLOBIN A1C
Hgb A1c MFr Bld: 5 % of total Hgb (ref <5.7)
Mean Plasma Glucose: 97 (calc)
eAG (mmol/L): 5.4 (calc)

## 2018-11-05 LAB — HEPATITIS C ANTIBODY
Hepatitis C Ab: NONREACTIVE
SIGNAL TO CUT-OFF: 0.01 (ref <1.00)

## 2018-11-05 LAB — HEPATITIS B SURFACE ANTIGEN: Hepatitis B Surface Ag: NONREACTIVE

## 2018-11-05 LAB — RPR TITER: RPR Titer: 1:1 {titer} — ABNORMAL HIGH

## 2018-11-05 LAB — HIV ANTIBODY (ROUTINE TESTING W REFLEX): HIV 1&2 Ab, 4th Generation: NONREACTIVE

## 2018-11-05 LAB — TSH: TSH: 2.05 mIU/L (ref 0.40–4.50)

## 2018-11-05 LAB — RPR: RPR Ser Ql: REACTIVE — AB

## 2018-11-07 ENCOUNTER — Other Ambulatory Visit: Payer: Self-pay

## 2018-11-10 ENCOUNTER — Ambulatory Visit (INDEPENDENT_AMBULATORY_CARE_PROVIDER_SITE_OTHER): Payer: BC Managed Care – PPO | Admitting: Gynecology

## 2018-11-10 ENCOUNTER — Other Ambulatory Visit: Payer: Self-pay

## 2018-11-10 ENCOUNTER — Encounter: Payer: Self-pay | Admitting: Gynecology

## 2018-11-10 VITALS — BP 124/70 | Ht 67.0 in | Wt 132.0 lb

## 2018-11-10 DIAGNOSIS — Z01419 Encounter for gynecological examination (general) (routine) without abnormal findings: Secondary | ICD-10-CM

## 2018-11-10 DIAGNOSIS — Z113 Encounter for screening for infections with a predominantly sexual mode of transmission: Secondary | ICD-10-CM | POA: Diagnosis not present

## 2018-11-10 NOTE — Progress Notes (Signed)
    [place] 1960/09/08 [id number]        57 y.o.  G2P0020 for annual gynecologic exam.  Without gynecologic complaints  Past medical history,surgical history, problem list, medications, allergies, family history and social history were all reviewed and documented as reviewed in the EPIC chart.  ROS:  Performed with pertinent positives and negatives included in the history, assessment and plan.   Additional significant findings : None   Exam: Caryn Bee assistant Vitals:   11/10/18 0924  BP: 124/70  Weight: 132 lb (59.9 kg)  Height: 5\' 7"  (1.702 m)   Body mass index is 20.67 kg/m.  General appearance:  Normal affect, orientation and appearance. Skin: Grossly normal HEENT: Without gross lesions.  No cervical or supraclavicular adenopathy. Thyroid normal.  Lungs:  Clear without wheezing, rales or rhonchi Cardiac: RR, without RMG Abdominal:  Soft, nontender, without masses, guarding, rebound, organomegaly or hernia Breasts:  Examined lying and sitting without masses, retractions, discharge or axillary adenopathy. Pelvic:  Ext, BUS, Vagina: Normal with mild atrophic changes  Cervix: Normal with mild atrophic changes.  GC/chlamydia  Uterus: Anteverted, normal size, shape and contour, midline and mobile nontender   Adnexa: Without masses or tenderness    Anus and perineum: Normal   Rectovaginal: Normal sphincter tone without palpated masses or tenderness.    Assessment/Plan:  58 y.o. G51P0020 female for annual gynecologic exam.   1. Postmenopausal.  No significant menopausal symptoms or any vaginal bleeding. 2. STD screening.  Had labs drawn before visit.  Serum screening negative with the exception of RPR positive at titer 1-1.  Fluorescent treponemal antibody negative.  Discussed false positive screening with the patient.  GC/chlamydia screen done today at her request. 3. Pap smear/HPV 2019.  No Pap smear done today.  No history of abnormal Pap smears.  Plan repeat Pap  smear/HPV at 5-year interval per current screening guidelines. 4. Mammography 09/2018.  Continue with annual mammography next year.  Breast exam normal today. 5. Colonoscopy 2013.  Plans to schedule repeat colonoscopy now.  Names and numbers provided. 6. DEXA never.  Will plan further into the menopause.  Very active with good diet. 7. Health maintenance.  Recent lab work reviewed with her all normal with the exception of a low HDL.  Patient very active athletically.  Follow-up 1 year, sooner as needed.   Anastasio Auerbach MD, 9:52 AM 11/10/2018

## 2018-11-10 NOTE — Patient Instructions (Signed)
Schedule your colonoscopy with either:  [facility]   Address: [address], [place], [place] [address]  [phone]    or  [facility]  Address: [address], [place], [place] [address]  [phone]      

## 2018-11-10 NOTE — Addendum Note (Signed)
Addended by: Nelva Nay on: 11/10/2018 11:55 AM   Modules accepted: Orders

## 2018-11-11 LAB — C. TRACHOMATIS/N. GONORRHOEAE RNA
C. trachomatis RNA, TMA: NOT DETECTED
N. gonorrhoeae RNA, TMA: NOT DETECTED

## 2018-11-24 ENCOUNTER — Other Ambulatory Visit: Payer: Self-pay

## 2018-12-01 ENCOUNTER — Encounter: Payer: BLUE CROSS/BLUE SHIELD | Admitting: Gynecology

## 2018-12-15 ENCOUNTER — Other Ambulatory Visit: Payer: Self-pay

## 2018-12-16 ENCOUNTER — Encounter: Payer: Self-pay | Admitting: Gynecology

## 2018-12-16 ENCOUNTER — Ambulatory Visit (INDEPENDENT_AMBULATORY_CARE_PROVIDER_SITE_OTHER): Payer: BC Managed Care – PPO | Admitting: Gynecology

## 2018-12-16 VITALS — BP 118/74

## 2018-12-16 DIAGNOSIS — N898 Other specified noninflammatory disorders of vagina: Secondary | ICD-10-CM | POA: Diagnosis not present

## 2018-12-16 DIAGNOSIS — R300 Dysuria: Secondary | ICD-10-CM

## 2018-12-16 DIAGNOSIS — N3000 Acute cystitis without hematuria: Secondary | ICD-10-CM | POA: Diagnosis not present

## 2018-12-16 LAB — WET PREP FOR TRICH, YEAST, CLUE

## 2018-12-16 MED ORDER — CIPROFLOXACIN HCL 250 MG PO TABS: 250.0000 mg | ORAL_TABLET | Freq: Two times a day (BID) | ORAL | 0 refills | Status: DC

## 2018-12-16 NOTE — Patient Instructions (Signed)
Take the ciprofloxacin twice daily for 5 days.  Follow-up if your symptoms persist.

## 2018-12-16 NOTE — Progress Notes (Signed)
    [place] 1960-04-14 [id number]        58 y.o.  G2P0020 presents with a month history of intermittent urethral discomfort.  Patient notes a sharp stabbing pinprick like sensation at her urethra.  Does not last long but for minutes.  No frequency, urgency, dysuria, suprapubic discomfort, low back pain, fever or chills.  Thought maybe that her partner scratched her with his finger.  No discharge, itching or irritation, odor.  Past medical history,surgical history, problem list, medications, allergies, family history and social history were all reviewed and documented in the EPIC chart.  Directed ROS with pertinent positives and negatives documented in the history of present illness/assessment and plan.  Exam: Christine Rangel assistant Vitals:   12/16/18 1212  BP: 118/74   General appearance:  Normal Abdomen soft nontender without masses guarding rebound Pelvic external BUS vagina with atrophic changes.  No discharge.  Urethral opening/periurethral mucosa normal.  Bimanual without masses or tenderness  Assessment/Plan:  58 y.o. G2P0020 with history as above.  Wet prep negative.  Urine analysis consistent with UTI.  We discussed an unusual presentation for UTI but definitely has white cells and bacteria in her urine.  Will treat with ciprofloxacin 250 mg twice daily x5 days.  She will follow-up if her symptoms persist, worsen or recur.  Black box warning reviewed.    Anastasio Auerbach MD, 12:34 PM 12/16/2018

## 2018-12-19 LAB — URINALYSIS, COMPLETE W/RFL CULTURE
Bilirubin Urine: NEGATIVE
Glucose, UA: NEGATIVE
Hyaline Cast: NONE SEEN /LPF
Ketones, ur: NEGATIVE
Nitrites, Initial: NEGATIVE
Protein, ur: NEGATIVE
Specific Gravity, Urine: 1.004 (ref 1.001–1.03)
pH: 6 (ref 5.0–8.0)

## 2018-12-19 LAB — URINE CULTURE
MICRO NUMBER:: 887868
SPECIMEN QUALITY:: ADEQUATE

## 2018-12-19 LAB — CULTURE INDICATED

## 2018-12-23 ENCOUNTER — Ambulatory Visit (INDEPENDENT_AMBULATORY_CARE_PROVIDER_SITE_OTHER): Payer: BC Managed Care – PPO | Admitting: Women's Health

## 2018-12-23 ENCOUNTER — Other Ambulatory Visit: Payer: Self-pay

## 2018-12-23 ENCOUNTER — Encounter: Payer: Self-pay | Admitting: Women's Health

## 2018-12-23 VITALS — BP 120/70

## 2018-12-23 DIAGNOSIS — R300 Dysuria: Secondary | ICD-10-CM

## 2018-12-23 DIAGNOSIS — B3731 Acute candidiasis of vulva and vagina: Secondary | ICD-10-CM

## 2018-12-23 DIAGNOSIS — N898 Other specified noninflammatory disorders of vagina: Secondary | ICD-10-CM

## 2018-12-23 DIAGNOSIS — B373 Candidiasis of vulva and vagina: Secondary | ICD-10-CM | POA: Diagnosis not present

## 2018-12-23 LAB — WET PREP FOR TRICH, YEAST, CLUE

## 2018-12-23 MED ORDER — FLUCONAZOLE 150 MG PO TABS: 150.0000 mg | ORAL_TABLET | Freq: Once | ORAL | 1 refills | Status: AC

## 2018-12-23 MED ORDER — PHENAZOPYRIDINE HCL 200 MG PO TABS: 200.0000 mg | ORAL_TABLET | Freq: Three times a day (TID) | ORAL | 0 refills | Status: DC | PRN

## 2018-12-23 NOTE — Progress Notes (Signed)
58 year old S WF G2, P0 presents with complaint of pain at urinary meatus, states feels like pins-and-needles, stabbing type sensation to her urethra.  Denies pain at end of stream of urination, frequency, urgency, low back pain or fever.  12/16/2018 was seen here in the office,  treated for UTI with Cipro 250 twice daily for 5 days with relief during medication, sensation has returned since stopping.  Urine culture was positive for 50-100,000 Staphylococcus.  Negative STD screen with partner.  Postmenopausal on no HRT with no bleeding.  No known medical problems.  Does ride bikes frequently both Trail and street.  Does wear bike shorts.  No change in routine.  Exam: Appears well.  External genitalia mild erythema at introitus, speculum exam scant discharge wet prep positive for yeast.  No erythema, visible lesions or erythema at meatus.  Wet prep positive for yeast. UA: Negative nitrites, negative leukocytes, no WBCs, no RBCs, no bacteria  Urinary meatus discomfort Yeast vaginitis  Plan: Diflucan 150 p.o. x1 dose refill given.  Encouraged to avoid bike riding for 1 week to see if that helps sensation.  Pyridium 200 mg 3 times daily for several days.  Reviewed urine is negative reviewed Cipro is broad-spectrum effective for many organisms and usually resolves UTIs.  Continue to drink plenty of water call or return in continued problems.  Reviewed normality of UA.

## 2018-12-23 NOTE — Patient Instructions (Signed)
Vaginal Yeast infection, Adult  Vaginal yeast infection is a condition that causes vaginal discharge as well as soreness, swelling, and redness (inflammation) of the vagina. This is a common condition. Some women get this infection frequently. What are the causes? This condition is caused by a change in the normal balance of the yeast (candida) and bacteria that live in the vagina. This change causes an overgrowth of yeast, which causes the inflammation. What increases the risk? The condition is more likely to develop in women who:  Take antibiotic medicines.  Have diabetes.  Take birth control pills.  Are pregnant.  Douche often.  Have a weak body defense system (immune system).  Have been taking steroid medicines for a long time.  Frequently wear tight clothing. What are the signs or symptoms? Symptoms of this condition include:  White, thick, creamy vaginal discharge.  Swelling, itching, redness, and irritation of the vagina. The lips of the vagina (vulva) may be affected as well.  Pain or a burning feeling while urinating.  Pain during sex. How is this diagnosed? This condition is diagnosed based on:  Your medical history.  A physical exam.  A pelvic exam. Your health care provider will examine a sample of your vaginal discharge under a microscope. Your health care provider may send this sample for testing to confirm the diagnosis. How is this treated? This condition is treated with medicine. Medicines may be over-the-counter or prescription. You may be told to use one or more of the following:  Medicine that is taken by mouth (orally).  Medicine that is applied as a cream (topically).  Medicine that is inserted directly into the vagina (suppository). Follow these instructions at home:  Lifestyle  Do not have sex until your health care provider approves. Tell your sex partner that you have a yeast infection. That person should go to his or her health care  provider and ask if they should also be treated.  Do not wear tight clothes, such as pantyhose or tight pants.  Wear breathable cotton underwear. General instructions  Take or apply over-the-counter and prescription medicines only as told by your health care provider.  Eat more yogurt. This may help to keep your yeast infection from returning.  Do not use tampons until your health care provider approves.  Try taking a sitz bath to help with discomfort. This is a warm water bath that is taken while you are sitting down. The water should only come up to your hips and should cover your buttocks. Do this 3-4 times per day or as told by your health care provider.  Do not douche.  If you have diabetes, keep your blood sugar levels under control.  Keep all follow-up visits as told by your health care provider. This is important. Contact a health care provider if:  You have a fever.  Your symptoms go away and then return.  Your symptoms do not get better with treatment.  Your symptoms get worse.  You have new symptoms.  You develop blisters in or around your vagina.  You have blood coming from your vagina and it is not your menstrual period.  You develop pain in your abdomen. Summary  Vaginal yeast infection is a condition that causes discharge as well as soreness, swelling, and redness (inflammation) of the vagina.  This condition is treated with medicine. Medicines may be over-the-counter or prescription.  Take or apply over-the-counter and prescription medicines only as told by your health care provider.  Do not douche.   Do not have sex or use tampons until your health care provider approves.  Contact a health care provider if your symptoms do not get better with treatment or your symptoms go away and then return. This information is not intended to replace advice given to you by your health care provider. Make sure you discuss any questions you have with your health care  provider. Document Released: 12/27/2004 Document Revised: 08/05/2017 Document Reviewed: 08/05/2017 Elsevier Patient Education  2020 [employer]  

## 2018-12-24 LAB — URINALYSIS, COMPLETE W/RFL CULTURE
Bacteria, UA: NONE SEEN /HPF
Bilirubin Urine: NEGATIVE
Glucose, UA: NEGATIVE
Hgb urine dipstick: NEGATIVE
Hyaline Cast: NONE SEEN /LPF
Ketones, ur: NEGATIVE
Leukocyte Esterase: NEGATIVE
Nitrites, Initial: NEGATIVE
Protein, ur: NEGATIVE
RBC / HPF: NONE SEEN /HPF (ref 0–2)
Specific Gravity, Urine: 1.005 (ref 1.001–1.03)
WBC, UA: NONE SEEN /HPF (ref 0–5)
pH: 5 (ref 5.0–8.0)

## 2018-12-24 LAB — NO CULTURE INDICATED

## 2018-12-30 ENCOUNTER — Encounter: Payer: Self-pay | Admitting: Gynecology

## 2019-01-20 DIAGNOSIS — Z23 Encounter for immunization: Secondary | ICD-10-CM | POA: Diagnosis not present

## 2019-02-11 ENCOUNTER — Encounter: Payer: Self-pay | Admitting: Gynecology

## 2019-02-11 ENCOUNTER — Ambulatory Visit (INDEPENDENT_AMBULATORY_CARE_PROVIDER_SITE_OTHER): Payer: BC Managed Care – PPO | Admitting: Gynecology

## 2019-02-11 ENCOUNTER — Other Ambulatory Visit: Payer: Self-pay

## 2019-02-11 VITALS — BP 118/76

## 2019-02-11 DIAGNOSIS — R350 Frequency of micturition: Secondary | ICD-10-CM

## 2019-02-11 DIAGNOSIS — N898 Other specified noninflammatory disorders of vagina: Secondary | ICD-10-CM

## 2019-02-11 LAB — URINALYSIS, COMPLETE W/RFL CULTURE
Bacteria, UA: NONE SEEN /HPF
Bilirubin Urine: NEGATIVE
Glucose, UA: NEGATIVE
Hgb urine dipstick: NEGATIVE
Hyaline Cast: NONE SEEN /LPF
Ketones, ur: NEGATIVE
Leukocyte Esterase: NEGATIVE
Nitrites, Initial: NEGATIVE
Protein, ur: NEGATIVE
RBC / HPF: NONE SEEN /HPF (ref 0–2)
Specific Gravity, Urine: 1.01 (ref 1.001–1.03)
WBC, UA: NONE SEEN /HPF (ref 0–5)
pH: 6 (ref 5.0–8.0)

## 2019-02-11 LAB — WET PREP FOR TRICH, YEAST, CLUE

## 2019-02-11 LAB — NO CULTURE INDICATED

## 2019-02-11 MED ORDER — ESTRADIOL 10 MCG VA TABS: 1.0000 | ORAL_TABLET | VAGINAL | 11 refills | Status: DC

## 2019-02-11 NOTE — Patient Instructions (Signed)
Start on the vaginal estrogen tablets twice weekly.  Call if you have any issues with this.

## 2019-02-11 NOTE — Progress Notes (Signed)
    [place] 1960-11-07 [id number]        58 y.o.  G2P0020 presents with several days of urinary frequency and urethral twinges that she has with her UTIs.  No real urgency low back pain fever or chills.  No discharge irritation or itching  Past medical history,surgical history, problem list, medications, allergies, family history and social history were all reviewed and documented in the EPIC chart.  Directed ROS with pertinent positives and negatives documented in the history of present illness/assessment and plan.  Exam: Caryn Bee assistant Vitals:   02/11/19 1404  BP: 118/76   General appearance:  Normal Spine straight without CVA tenderness Abdomen soft nontender without mass guarding rebound Pelvic external BUS vagina with atrophic changes.  Scant discharge.  Bimanual without masses or tenderness  Assessment/Plan:  58 y.o. G2P0020 with history and exam as above.  Wet prep and urine analysis are negative.  We discussed the possibility of atrophic vaginitis contributing to her symptoms.  We discussed options for treatment to include vaginal estrogen such as rings, tablets, creams.  Possible absorption with systemic effects to include thrombosis endometrial stimulation and the breast cancer issue reviewed.  Will start with Vagifem at her choice twice weekly and will see how she does.  I cultured the urine and will treat if it grows out any bacteria but for now we will hold on antibiotics given that the urine analysis is totally negative.   Anastasio Auerbach MD, 2:17 PM 02/11/2019

## 2019-02-11 NOTE — Addendum Note (Signed)
Addended by: Nelva Nay on: 02/11/2019 02:38 PM   Modules accepted: Orders

## 2019-02-13 ENCOUNTER — Encounter: Payer: Self-pay | Admitting: Gynecology

## 2019-02-13 LAB — URINE CULTURE
MICRO NUMBER:: 1091350
SPECIMEN QUALITY:: ADEQUATE

## 2019-03-02 DIAGNOSIS — S93491A Sprain of other ligament of right ankle, initial encounter: Secondary | ICD-10-CM | POA: Diagnosis not present

## 2019-03-11 ENCOUNTER — Telehealth: Payer: Self-pay | Admitting: *Deleted

## 2019-03-11 MED ORDER — FLUCONAZOLE 150 MG PO TABS: 150.0000 mg | ORAL_TABLET | Freq: Once | ORAL | 0 refills | Status: AC

## 2019-03-11 NOTE — Telephone Encounter (Signed)
Patient informed, Rx sent.  

## 2019-03-11 NOTE — Telephone Encounter (Signed)
Okay for Diflucan 150 mg x 1 dose 

## 2019-03-11 NOTE — Telephone Encounter (Signed)
Patient called requesting diflucan tablet to help with yeast infection, due to starting vagifem 10 mcg tablet, reports yeast. Having vaginal itching. Please advise

## 2019-03-16 ENCOUNTER — Other Ambulatory Visit: Payer: Self-pay

## 2019-03-17 ENCOUNTER — Ambulatory Visit (INDEPENDENT_AMBULATORY_CARE_PROVIDER_SITE_OTHER): Payer: BC Managed Care – PPO | Admitting: Gynecology

## 2019-03-17 ENCOUNTER — Encounter: Payer: Self-pay | Admitting: Gynecology

## 2019-03-17 VITALS — BP 118/78

## 2019-03-17 DIAGNOSIS — F902 Attention-deficit hyperactivity disorder, combined type: Secondary | ICD-10-CM | POA: Diagnosis not present

## 2019-03-17 DIAGNOSIS — F411 Generalized anxiety disorder: Secondary | ICD-10-CM | POA: Diagnosis not present

## 2019-03-17 DIAGNOSIS — F3342 Major depressive disorder, recurrent, in full remission: Secondary | ICD-10-CM | POA: Diagnosis not present

## 2019-03-17 DIAGNOSIS — N898 Other specified noninflammatory disorders of vagina: Secondary | ICD-10-CM

## 2019-03-17 LAB — WET PREP FOR TRICH, YEAST, CLUE

## 2019-03-17 MED ORDER — VALACYCLOVIR HCL 1 G PO TABS: 1000.0000 mg | ORAL_TABLET | Freq: Two times a day (BID) | ORAL | 1 refills | Status: DC

## 2019-03-17 MED ORDER — FLUCONAZOLE 150 MG PO TABS: 150.0000 mg | ORAL_TABLET | Freq: Once | ORAL | 0 refills | Status: AC

## 2019-03-17 NOTE — Patient Instructions (Signed)
Take the prescribed Diflucan pill.  Follow-up if the vaginal irritation continues.

## 2019-03-17 NOTE — Progress Notes (Signed)
    [place] [birth year] [id number]        58 y.o.  G2P0020 presents having developed vaginal irritation and itching with discharge.  Was prescribed Diflucan and notes dramatic improvement in her symptoms but still with a lingering irritation.  No urinary symptoms.  This all seemed to start after starting her Vagifem and she discontinued this.  Past medical history,surgical history, problem list, medications, allergies, family history and social history were all reviewed and documented in the EPIC chart.  Directed ROS with pertinent positives and negatives documented in the history of present illness/assessment and plan.  Exam: Caryn Bee assistant Vitals:   03/17/19 0926  BP: 118/78   General appearance:  Normal Abdomen soft nontender without mass guarding rebound Pelvic external BUS vagina normal.  Cervix normal.  Uterus grossly normal midline mobile nontender.  Adnexa without masses or tenderness.  Assessment/Plan:  58 y.o. G2P0020 with history as above.  Wet prep is negative.  Suspect that she has a low level yeast lingering.  Will treat with Diflucan 150 mg x 1 dose.  Follow-up if she has persistence of her symptoms.  I also refilled her Valtrex 1000 mg #30 with 1 refill that she takes twice daily at the earliest onset of herpes labialis outbreak.  Follow-up at annual exam August 2021, sooner if any issues.    Anastasio Auerbach MD, 10:03 AM 03/17/2019

## 2019-03-24 ENCOUNTER — Other Ambulatory Visit: Payer: Self-pay | Admitting: Gynecology

## 2019-03-24 NOTE — Telephone Encounter (Signed)
I spoke with patient and she said she has been extremely stressed and has been taking bid to prevent cold sores.

## 2019-05-29 DIAGNOSIS — M79661 Pain in right lower leg: Secondary | ICD-10-CM | POA: Diagnosis not present

## 2019-06-02 DIAGNOSIS — L570 Actinic keratosis: Secondary | ICD-10-CM | POA: Diagnosis not present

## 2019-06-04 DIAGNOSIS — M79661 Pain in right lower leg: Secondary | ICD-10-CM | POA: Diagnosis not present

## 2019-06-05 DIAGNOSIS — M79661 Pain in right lower leg: Secondary | ICD-10-CM | POA: Diagnosis not present

## 2019-07-06 DIAGNOSIS — H26101 Unspecified traumatic cataract, right eye: Secondary | ICD-10-CM | POA: Diagnosis not present

## 2019-07-06 DIAGNOSIS — H524 Presbyopia: Secondary | ICD-10-CM | POA: Diagnosis not present

## 2019-07-07 DIAGNOSIS — Z01818 Encounter for other preprocedural examination: Secondary | ICD-10-CM | POA: Diagnosis not present

## 2019-07-14 DIAGNOSIS — L539 Erythematous condition, unspecified: Secondary | ICD-10-CM | POA: Diagnosis not present

## 2019-08-17 DIAGNOSIS — Z1159 Encounter for screening for other viral diseases: Secondary | ICD-10-CM | POA: Diagnosis not present

## 2019-08-20 DIAGNOSIS — K5730 Diverticulosis of large intestine without perforation or abscess without bleeding: Secondary | ICD-10-CM | POA: Diagnosis not present

## 2019-08-20 DIAGNOSIS — Z1211 Encounter for screening for malignant neoplasm of colon: Secondary | ICD-10-CM | POA: Diagnosis not present

## 2019-09-03 ENCOUNTER — Other Ambulatory Visit: Payer: Self-pay | Admitting: Obstetrics and Gynecology

## 2019-09-03 DIAGNOSIS — Z1231 Encounter for screening mammogram for malignant neoplasm of breast: Secondary | ICD-10-CM

## 2019-09-04 DIAGNOSIS — L578 Other skin changes due to chronic exposure to nonionizing radiation: Secondary | ICD-10-CM | POA: Diagnosis not present

## 2019-09-04 DIAGNOSIS — D225 Melanocytic nevi of trunk: Secondary | ICD-10-CM | POA: Diagnosis not present

## 2019-09-04 DIAGNOSIS — L564 Polymorphous light eruption: Secondary | ICD-10-CM | POA: Diagnosis not present

## 2019-09-04 DIAGNOSIS — D2262 Melanocytic nevi of left upper limb, including shoulder: Secondary | ICD-10-CM | POA: Diagnosis not present

## 2019-09-12 ENCOUNTER — Other Ambulatory Visit: Payer: Self-pay

## 2019-09-12 ENCOUNTER — Encounter (HOSPITAL_BASED_OUTPATIENT_CLINIC_OR_DEPARTMENT_OTHER): Payer: Self-pay | Admitting: Emergency Medicine

## 2019-09-12 DIAGNOSIS — Z87891 Personal history of nicotine dependence: Secondary | ICD-10-CM | POA: Insufficient documentation

## 2019-09-12 DIAGNOSIS — K5900 Constipation, unspecified: Secondary | ICD-10-CM | POA: Diagnosis not present

## 2019-09-12 DIAGNOSIS — Z79899 Other long term (current) drug therapy: Secondary | ICD-10-CM | POA: Diagnosis not present

## 2019-09-12 DIAGNOSIS — Z96612 Presence of left artificial shoulder joint: Secondary | ICD-10-CM | POA: Diagnosis not present

## 2019-09-12 NOTE — ED Triage Notes (Signed)
Pt states no BM since Wednesday. States she drank Magnesium Citrate at 1000 this am with no results and a suppository at 2245 tonight without results. Pt reports recent colonoscopy with only findings being diverticula.

## 2019-09-13 ENCOUNTER — Encounter (HOSPITAL_BASED_OUTPATIENT_CLINIC_OR_DEPARTMENT_OTHER): Payer: Self-pay

## 2019-09-13 ENCOUNTER — Emergency Department (HOSPITAL_BASED_OUTPATIENT_CLINIC_OR_DEPARTMENT_OTHER)
Admission: EM | Admit: 2019-09-13 | Discharge: 2019-09-13 | Disposition: A | Discharge status: Home / Self Care | Payer: BC Managed Care – PPO | Attending: Emergency Medicine | Admitting: Emergency Medicine

## 2019-09-13 ENCOUNTER — Emergency Department (HOSPITAL_BASED_OUTPATIENT_CLINIC_OR_DEPARTMENT_OTHER): Payer: BC Managed Care – PPO

## 2019-09-13 DIAGNOSIS — K5900 Constipation, unspecified: Secondary | ICD-10-CM | POA: Diagnosis not present

## 2019-09-13 LAB — CBC WITH DIFFERENTIAL/PLATELET
Abs Immature Granulocytes: 0.03 10*3/uL (ref 0.00–0.07)
Basophils Absolute: 0 10*3/uL (ref 0.0–0.1)
Basophils Relative: 0 %
Eosinophils Absolute: 0.1 10*3/uL (ref 0.0–0.5)
Eosinophils Relative: 1 %
HCT: 35.6 % — ABNORMAL LOW (ref 36.0–46.0)
Hemoglobin: 12.3 g/dL (ref 12.0–15.0)
Immature Granulocytes: 0 %
Lymphocytes Relative: 15 %
Lymphs Abs: 1.2 10*3/uL (ref 0.7–4.0)
MCH: 33 pg (ref 26.0–34.0)
MCHC: 34.6 g/dL (ref 30.0–36.0)
MCV: 95.4 fL (ref 80.0–100.0)
Monocytes Absolute: 0.8 10*3/uL (ref 0.1–1.0)
Monocytes Relative: 10 %
Neutro Abs: 5.7 10*3/uL (ref 1.7–7.7)
Neutrophils Relative %: 74 %
Platelets: 291 10*3/uL (ref 150–400)
RBC: 3.73 MIL/uL — ABNORMAL LOW (ref 3.87–5.11)
RDW: 11.4 % — ABNORMAL LOW (ref 11.5–15.5)
WBC: 7.7 10*3/uL (ref 4.0–10.5)
nRBC: 0 % (ref 0.0–0.2)

## 2019-09-13 LAB — BASIC METABOLIC PANEL
Anion gap: 10 (ref 5–15)
BUN: 9 mg/dL (ref 6–20)
CO2: 28 mmol/L (ref 22–32)
Calcium: 9.1 mg/dL (ref 8.9–10.3)
Chloride: 96 mmol/L — ABNORMAL LOW (ref 98–111)
Creatinine, Ser: 0.79 mg/dL (ref 0.44–1.00)
GFR calc Af Amer: >60 mL/min (ref >60)
GFR calc non Af Amer: >60 mL/min (ref >60)
Glucose, Bld: 113 mg/dL — ABNORMAL HIGH (ref 70–99)
Potassium: 4 mmol/L (ref 3.5–5.1)
Sodium: 134 mmol/L — ABNORMAL LOW (ref 135–145)

## 2019-09-13 MED ORDER — FLEET ENEMA 7-19 GM/118ML RE ENEM
1.0000 | ENEMA | Freq: Once | RECTAL | Status: AC
  Administered 2019-09-13: 1 via RECTAL
  Filled 2019-09-13: qty 1

## 2019-09-13 MED ORDER — POLYETHYLENE GLYCOL 3350 17 G PO PACK
17.0000 g | PACK | Freq: Every day | ORAL | Status: DC
  Filled 2019-09-13: qty 1

## 2019-09-13 MED ORDER — IOHEXOL 300 MG/ML  SOLN
100.0000 mL | Freq: Once | INTRAMUSCULAR | Status: AC | PRN
  Administered 2019-09-13: 100 mL via INTRAVENOUS

## 2019-09-13 MED ORDER — KETOROLAC TROMETHAMINE 30 MG/ML IJ SOLN
30.0000 mg | Freq: Once | INTRAMUSCULAR | Status: AC
  Administered 2019-09-13: 30 mg via INTRAVENOUS
  Filled 2019-09-13: qty 1

## 2019-09-13 NOTE — Discharge Instructions (Signed)
Miralax one capful twice daily for 7 days then once daily thereafter.  Clear liquid diet x 3 days.

## 2019-09-13 NOTE — ED Provider Notes (Addendum)
[facility] EMERGENCY DEPARTMENT Provider Note   CSN: [id number] Arrival date & time: 09/12/19  2333     History Chief Complaint  Patient presents with  . Constipation    Christine Rangel is a 59 y.o. female.  The history is provided by the patient.  Constipation Severity:  Severe Time since last bowel movement:  5 days Timing:  Constant Progression:  Unchanged Chronicity:  New Context: not dehydration, not dietary changes, not medication, not narcotics and not stress   Stool description:  None produced Unusual stool frequency:  Daily  Relieved by:  Nothing Worsened by:  Nothing Ineffective treatments:  None tried Associated symptoms: no abdominal pain, no anorexia, no back pain, no diarrhea, no dysuria, no fever, no flatus, no hematochezia, no nausea, no urinary retention and no vomiting   Risk factors: no change in medication   Patient with no significant PMH reports constipation since eating out x 2 following a bike race earlier in the week.  She reports eating 2 unusually large meals laden with cheese.  Symptoms have not improved with a suppository and patient has cramping following taking mag citrate, yet no stool is produced.  No f/c/r.  No vomiting.       Past Medical History:  Diagnosis Date  . Arthritis   . PONV (postoperative nausea and vomiting)    "couldn't wake me up" kept for overnight stay    Patient Active Problem List   Diagnosis Date Noted  . Hamstring muscle strain 07/09/2014  . ADD (attention deficit disorder) 07/01/2014  . Swelling of limb 06/28/2014    Past Surgical History:  Procedure Laterality Date  . HAMSTRING AVULSION REPAIR Right Aug, 2016  . HAMSTRING AVULSION REPAIR Left 01/13/2015   Procedure: REPAIR LEFT PROXIMAL HAMSTRING AVULSION FROM PELVIS ;  Surgeon: Loreta Ave, MD;  Location: [facility];  Service: Orthopedics;  Laterality: Left;  . JOINT REPLACEMENT Left    shoulder  . SHOULDER ARTHROSCOPY Left  1995, 2001, 2006     OB History    Gravida  2   Para      Term      Preterm      AB  2   Living  0     SAB  2   TAB      Ectopic      Multiple      Live Births              Family History  Problem Relation Age of Onset  . Breast cancer Mother 31  . Diabetes Maternal Grandmother   . Diabetes Maternal Grandfather   . Diabetes Paternal Grandmother   . Diabetes Paternal Grandfather   . Cancer Father        Lung  . COPD Father     Social History   Tobacco Use  . Smoking status: Former Smoker    Packs/day: 1.50    Years: 27.00    Pack years: 40.50    Types: Cigarettes    Quit date: 04/03/2004    Years since quitting: 15.4  . Smokeless tobacco: Never Used  Vaping Use  . Vaping Use: Never used  Substance Use Topics  . Alcohol use: Yes    Alcohol/week: 0.0 standard drinks    Comment: Rare  . Drug use: No    Home Medications Prior to Admission medications   Medication Sig Start Date End Date Taking? Authorizing Provider  amoxicillin (AMOXIL) 500 MG capsule Take 500  mg by mouth 3 (three) times daily. Prior to dental procedures    [provider]  BIOTIN PO Take by mouth.    [provider]  buPROPion (WELLBUTRIN XL) 150 MG 24 hr tablet Take 150 mg by mouth daily.    [provider]  Calcium Carb-Cholecalciferol (CALCIUM + D3 PO) Take 1 tablet by mouth.    [provider]  glucosamine-chondroitin 500-400 MG tablet Take 1 tablet by mouth daily.    [provider]  Multiple Vitamin (MULTIVITAMIN WITH MINERALS) TABS tablet Take 1 tablet by mouth daily.    [provider]  Omega-3 Fatty Acids (FISH OIL PO) Take 2 capsules by mouth.    [provider]  TURMERIC PO Take by mouth.    [provider]  valACYclovir (VALTREX) 1000 MG tablet Take 1 tablet (1,000 mg total) by mouth 2 (two) times daily. 03/17/19   Fontaine, Belinda Block, MD    Allergies    Morphine and related  Review of Systems    Review of Systems  Constitutional: Negative for fever.  HENT: Negative for congestion.   Respiratory: Negative for shortness of breath.   Cardiovascular: Negative for chest pain.  Gastrointestinal: Positive for constipation. Negative for abdominal pain, anorexia, diarrhea, flatus, hematochezia, nausea and vomiting.  Genitourinary: Negative for dysuria.  Musculoskeletal: Negative for back pain.  Neurological: Negative for dizziness.  Psychiatric/Behavioral: Negative for agitation.  All other systems reviewed and are negative.   Physical Exam Updated Vital Signs BP 128/64 (BP Location: Right Arm)   Pulse 78   Temp 98.6 F (37 C) (Oral)   Resp 18   Ht 5\' 7"  (1.702 m)   Wt 63 kg   SpO2 99%   BMI 21.77 kg/m   Physical Exam Vitals and nursing note reviewed.  Constitutional:      Appearance: Normal appearance. She is not ill-appearing.  HENT:     Head: Normocephalic and atraumatic.     Nose: Nose normal.  Eyes:     Conjunctiva/sclera: Conjunctivae normal.     Pupils: Pupils are equal, round, and reactive to light.  Cardiovascular:     Rate and Rhythm: Normal rate and regular rhythm.     Pulses: Normal pulses.     Heart sounds: Normal heart sounds.  Pulmonary:     Effort: Pulmonary effort is normal.     Breath sounds: Normal breath sounds.  Abdominal:     General: Abdomen is flat. Bowel sounds are normal. There is no distension.     Palpations: Abdomen is soft. There is no mass.     Tenderness: There is no abdominal tenderness. There is no guarding or rebound.  Musculoskeletal:        General: Normal range of motion.     Cervical back: Normal range of motion and neck supple.  Skin:    General: Skin is warm and dry.     Capillary Refill: Capillary refill takes less than 2 seconds.  Neurological:     General: No focal deficit present.     Mental Status: She is alert and oriented to person, place, and time.     Deep Tendon Reflexes: Reflexes normal.  Psychiatric:          Mood and Affect: Mood normal.        Behavior: Behavior normal.     ED Results / Procedures / Treatments   Labs (all labs ordered are listed, but only abnormal results are displayed) Results for orders placed or  performed during the hospital encounter of 09/13/19  CBC with Differential/Platelet  Result Value Ref Range   WBC 7.7 4.0 - 10.5 K/uL   RBC 3.73 (L) 3.87 - 5.11 MIL/uL   Hemoglobin 12.3 12.0 - 15.0 g/dL   HCT 40.1 (L) 36 - 46 %   MCV 95.4 80.0 - 100.0 fL   MCH 33.0 26.0 - 34.0 pg   MCHC 34.6 30.0 - 36.0 g/dL   RDW 02.7 (L) 25.3 - 66.4 %   Platelets 291 150 - 400 K/uL   nRBC 0.0 0.0 - 0.2 %   Neutrophils Relative % 74 %   Neutro Abs 5.7 1.7 - 7.7 K/uL   Lymphocytes Relative 15 %   Lymphs Abs 1.2 0.7 - 4.0 K/uL   Monocytes Relative 10 %   Monocytes Absolute 0.8 0 - 1 K/uL   Eosinophils Relative 1 %   Eosinophils Absolute 0.1 0 - 0 K/uL   Basophils Relative 0 %   Basophils Absolute 0.0 0 - 0 K/uL   Immature Granulocytes 0 %   Abs Immature Granulocytes 0.03 0.00 - 0.07 K/uL  Basic metabolic panel  Result Value Ref Range   Sodium 134 (L) 135 - 145 mmol/L   Potassium 4.0 3.5 - 5.1 mmol/L   Chloride 96 (L) 98 - 111 mmol/L   CO2 28 22 - 32 mmol/L   Glucose, Bld 113 (H) 70 - 99 mg/dL   BUN 9 6 - 20 mg/dL   Creatinine, Ser 4.03 0.44 - 1.00 mg/dL   Calcium 9.1 8.9 - 47.4 mg/dL   GFR calc non Af Amer >60 >60 mL/min   GFR calc Af Amer >60 >60 mL/min   Anion gap 10 5 - 15   DG Abdomen 1 View  Result Date: 09/13/2019 CLINICAL DATA:  Constipation EXAM: ABDOMEN - 1 VIEW COMPARISON:  None. FINDINGS: Mild gaseous distention of bowel diffusely. Moderate stool burden. No organomegaly, free air or suspicious calcification. No acute bony abnormality. IMPRESSION: Moderate stool burden with mild diffuse gaseous distention of bowel which may reflect mild ileus. Electronically Signed   By: Charlett Nose M.D.   On: 09/13/2019 00:15   CT ABDOMEN PELVIS W CONTRAST  Result Date:  09/13/2019 CLINICAL DATA:  Constipation EXAM: CT ABDOMEN AND PELVIS WITH CONTRAST TECHNIQUE: Multidetector CT imaging of the abdomen and pelvis was performed using the standard protocol following bolus administration of intravenous contrast. CONTRAST:  OMNIPAQUE IOHEXOL 300 MG/ML  SOLN COMPARISON:  None. FINDINGS: Lower chest: The lung bases are clear. The heart size is normal. Hepatobiliary: The liver is normal. Normal gallbladder.There is no biliary ductal dilation. Pancreas: Normal contours without ductal dilatation. No peripancreatic fluid collection. Spleen: Unremarkable. Adrenals/Urinary Tract: --Adrenal glands: Unremarkable. --Right kidney/ureter: No hydronephrosis or radiopaque kidney stones. --Left kidney/ureter: No hydronephrosis or radiopaque kidney stones. --Urinary bladder: Unremarkable. Stomach/Bowel: --Stomach/Duodenum: No hiatal hernia or other gastric abnormality. Normal duodenal course and caliber. --Small bowel: There is stasis in the distal small bowel. --Colon: There is a very large amount of stool in the colon. The rectum, sigmoid colon, and distal descending colon are relatively decompressed. There is no evidence for an obstructing mass. --Appendix: Normal. Vascular/Lymphatic: Atherosclerotic calcification is present within the non-aneurysmal abdominal aorta, without hemodynamically significant stenosis. --No retroperitoneal lymphadenopathy. --No mesenteric lymphadenopathy. --No pelvic or inguinal lymphadenopathy. Reproductive: Unremarkable Other: No ascites or free air. The abdominal wall is normal. Musculoskeletal. No acute displaced fractures. IMPRESSION: Very large stool burden. No evidence for an obstructing mass. No small bowel  obstruction. Electronically Signed   By: Katherine Mantle M.D.   On: 09/13/2019 02:16    Radiology DG Abdomen 1 View  Result Date: 09/13/2019 CLINICAL DATA:  Constipation EXAM: ABDOMEN - 1 VIEW COMPARISON:  None. FINDINGS: Mild gaseous distention of  bowel diffusely. Moderate stool burden. No organomegaly, free air or suspicious calcification. No acute bony abnormality. IMPRESSION: Moderate stool burden with mild diffuse gaseous distention of bowel which may reflect mild ileus. Electronically Signed   By: Charlett Nose M.D.   On: 09/13/2019 00:15   CT ABDOMEN PELVIS W CONTRAST  Result Date: 09/13/2019 CLINICAL DATA:  Constipation EXAM: CT ABDOMEN AND PELVIS WITH CONTRAST TECHNIQUE: Multidetector CT imaging of the abdomen and pelvis was performed using the standard protocol following bolus administration of intravenous contrast. CONTRAST:  OMNIPAQUE IOHEXOL 300 MG/ML  SOLN COMPARISON:  None. FINDINGS: Lower chest: The lung bases are clear. The heart size is normal. Hepatobiliary: The liver is normal. Normal gallbladder.There is no biliary ductal dilation. Pancreas: Normal contours without ductal dilatation. No peripancreatic fluid collection. Spleen: Unremarkable. Adrenals/Urinary Tract: --Adrenal glands: Unremarkable. --Right kidney/ureter: No hydronephrosis or radiopaque kidney stones. --Left kidney/ureter: No hydronephrosis or radiopaque kidney stones. --Urinary bladder: Unremarkable. Stomach/Bowel: --Stomach/Duodenum: No hiatal hernia or other gastric abnormality. Normal duodenal course and caliber. --Small bowel: There is stasis in the distal small bowel. --Colon: There is a very large amount of stool in the colon. The rectum, sigmoid colon, and distal descending colon are relatively decompressed. There is no evidence for an obstructing mass. --Appendix: Normal. Vascular/Lymphatic: Atherosclerotic calcification is present within the non-aneurysmal abdominal aorta, without hemodynamically significant stenosis. --No retroperitoneal lymphadenopathy. --No mesenteric lymphadenopathy. --No pelvic or inguinal lymphadenopathy. Reproductive: Unremarkable Other: No ascites or free air. The abdominal wall is normal. Musculoskeletal. No acute displaced  fractures. IMPRESSION: Very large stool burden. No evidence for an obstructing mass. No small bowel obstruction. Electronically Signed   By: Katherine Mantle M.D.   On: 09/13/2019 02:16    Procedures Procedures (including critical care time)  Medications Ordered in ED Medications  polyethylene glycol (MIRALAX / GLYCOLAX) packet 17 g (17 g Oral Not Given 09/13/19 0250)  iohexol (OMNIPAQUE) 300 MG/ML solution 100 mL (100 mLs Intravenous Contrast Given 09/13/19 0157)  ketorolac (TORADOL) 30 MG/ML injection 30 mg (30 mg Intravenous Given 09/13/19 0239)  sodium phosphate (FLEET) 7-19 GM/118ML enema 1 enema (1 enema Rectal Given 09/13/19 0240)    ED Course  I have reviewed the triage vital signs and the nursing notes.  Pertinent labs & imaging results that were available during my care of the patient were reviewed by me and considered in my medical decision making (see chart for details).    Patient with constipation on CT scan.  No ileus, no obstruction no stercoracolitis. Large gas and stool burden.  Cramping is secondary to gas and cramping from mag citrate. Patient wants to try an enema at home and this has been provided in the ED. In addition, patient is instructed to start Miralax BID x 7 days then go to QAm, clear liquids for 3 days.  Strict return precautions given for no stool, emesis, tense abdomen, fevers or any concerns.    Sammantha E Karpf was evaluated in Emergency Department on 09/13/2019 for the symptoms described in the history of present illness. She was evaluated in the context of the global COVID-19 pandemic, which necessitated consideration that the patient might be at risk for infection with the SARS-CoV-2 virus that causes COVID-19. Institutional protocols and algorithms  that pertain to the evaluation of patients at risk for COVID-19 are in a state of rapid change based on information released by regulatory bodies including the CDC and federal and state organizations. These policies  and algorithms were followed during the patient's care in the ED.  Final Clinical Impression(s) / ED Diagnoses Final diagnoses:  Constipation, unspecified constipation type   Return for intractable cough, coughing up blood,fevers >100.4 unrelieved by medication, shortness of breath, intractable vomiting, chest pain, shortness of breath, weakness,numbness, changes in speech, facial asymmetry,abdominal pain, passing out,Inability to tolerate liquids or food, cough, altered mental status or any concerns. No signs of systemic illness or infection. The patient is nontoxic-appearing on exam and vital signs are within normal limits.   I have reviewed the triage vital signs and the nursing notes. Pertinent labs &imaging results that were available during my care of the patient were reviewed by me and considered in my medical decision making (see chart for details).After history, exam, and medical workup I feel the patient has beenappropriately medically screened and is safe for discharge home. Pertinent diagnoses were discussed with the patient. Patient was given return precautions.   Parys Elenbaas, MD 09/13/19 [id number]    Cy BlamerPalumbo, Demmi Sindt, MD 09/13/19 [id number]

## 2019-09-29 ENCOUNTER — Other Ambulatory Visit: Payer: Self-pay

## 2019-09-29 ENCOUNTER — Ambulatory Visit
Admission: RE | Admit: 2019-09-29 | Discharge: 2019-09-29 | Disposition: A | Discharge status: Home / Self Care | Payer: BC Managed Care – PPO | Source: Ambulatory Visit | Attending: Obstetrics and Gynecology | Admitting: Obstetrics and Gynecology

## 2019-09-29 DIAGNOSIS — Z1231 Encounter for screening mammogram for malignant neoplasm of breast: Secondary | ICD-10-CM

## 2019-11-02 ENCOUNTER — Telehealth: Payer: Self-pay | Admitting: *Deleted

## 2019-11-02 DIAGNOSIS — Z13220 Encounter for screening for lipoid disorders: Secondary | ICD-10-CM

## 2019-11-02 DIAGNOSIS — Z01419 Encounter for gynecological examination (general) (routine) without abnormal findings: Secondary | ICD-10-CM

## 2019-11-02 NOTE — Telephone Encounter (Signed)
Patient has annual exam scheduled on 11/11/19 would like annual labs done prior to annual exam. Please advise

## 2019-11-03 NOTE — Telephone Encounter (Signed)
Annual labs to include CBC CMP lipid panel are fine to do ahead of time

## 2019-11-03 NOTE — Telephone Encounter (Signed)
Orders placed, lab appointment scheduled.

## 2019-11-06 ENCOUNTER — Other Ambulatory Visit: Payer: Self-pay

## 2019-11-06 ENCOUNTER — Other Ambulatory Visit: Payer: BC Managed Care – PPO

## 2019-11-06 DIAGNOSIS — Z13220 Encounter for screening for lipoid disorders: Secondary | ICD-10-CM

## 2019-11-06 DIAGNOSIS — Z01419 Encounter for gynecological examination (general) (routine) without abnormal findings: Secondary | ICD-10-CM

## 2019-11-07 LAB — COMPREHENSIVE METABOLIC PANEL
AG Ratio: 1.7 (calc) (ref 1.0–2.5)
ALT: 14 U/L (ref 6–29)
AST: 17 U/L (ref 10–35)
Albumin: 4.3 g/dL (ref 3.6–5.1)
Alkaline phosphatase (APISO): 65 U/L (ref 37–153)
BUN: 9 mg/dL (ref 7–25)
CO2: 27 mmol/L (ref 20–32)
Calcium: 9.2 mg/dL (ref 8.6–10.4)
Chloride: 101 mmol/L (ref 98–110)
Creat: 0.79 mg/dL (ref 0.50–1.05)
Globulin: 2.5 g/dL (calc) (ref 1.9–3.7)
Glucose, Bld: 87 mg/dL (ref 65–99)
Potassium: 4.1 mmol/L (ref 3.5–5.3)
Sodium: 136 mmol/L (ref 135–146)
Total Bilirubin: 0.6 mg/dL (ref 0.2–1.2)
Total Protein: 6.8 g/dL (ref 6.1–8.1)

## 2019-11-07 LAB — CBC
HCT: 37.1 % (ref 35.0–45.0)
Hemoglobin: 12.8 g/dL (ref 11.7–15.5)
MCH: 32.7 pg (ref 27.0–33.0)
MCHC: 34.5 g/dL (ref 32.0–36.0)
MCV: 94.9 fL (ref 80.0–100.0)
MPV: 9.6 fL (ref 7.5–12.5)
Platelets: 268 10*3/uL (ref 140–400)
RBC: 3.91 10*6/uL (ref 3.80–5.10)
RDW: 11.8 % (ref 11.0–15.0)
WBC: 4.3 10*3/uL (ref 3.8–10.8)

## 2019-11-07 LAB — LIPID PANEL
Cholesterol: 125 mg/dL (ref <200)
HDL: 39 mg/dL — ABNORMAL LOW (ref >=50)
LDL Cholesterol (Calc): 72 mg/dL (calc)
Non-HDL Cholesterol (Calc): 86 mg/dL (calc) (ref <130)
Total CHOL/HDL Ratio: 3.2 (calc) (ref <5.0)
Triglycerides: 59 mg/dL (ref <150)

## 2019-11-11 ENCOUNTER — Other Ambulatory Visit: Payer: Self-pay

## 2019-11-11 ENCOUNTER — Encounter: Payer: Self-pay | Admitting: Obstetrics and Gynecology

## 2019-11-11 ENCOUNTER — Ambulatory Visit (INDEPENDENT_AMBULATORY_CARE_PROVIDER_SITE_OTHER): Payer: BC Managed Care – PPO | Admitting: Obstetrics and Gynecology

## 2019-11-11 ENCOUNTER — Encounter: Payer: BC Managed Care – PPO | Admitting: Gynecology

## 2019-11-11 VITALS — BP 118/74 | Ht 68.0 in | Wt 139.0 lb

## 2019-11-11 DIAGNOSIS — N898 Other specified noninflammatory disorders of vagina: Secondary | ICD-10-CM | POA: Diagnosis not present

## 2019-11-11 DIAGNOSIS — Z01419 Encounter for gynecological examination (general) (routine) without abnormal findings: Secondary | ICD-10-CM

## 2019-11-11 DIAGNOSIS — Z113 Encounter for screening for infections with a predominantly sexual mode of transmission: Secondary | ICD-10-CM

## 2019-11-11 LAB — WET PREP FOR TRICH, YEAST, CLUE

## 2019-11-11 NOTE — Progress Notes (Signed)
Christine Rangel [facility] 07-14-1960 [id number]  SUBJECTIVE:  59 y.o. G57P0020 female for annual routine gynecologic exam. She has no gynecologic concerns.  Does request vaginal STD screen.  Current Outpatient Medications  Medication Sig Dispense Refill  . amoxicillin (AMOXIL) 500 MG capsule Take 500 mg by mouth 3 (three) times daily. Prior to dental procedures    . BIOTIN PO Take by mouth.    Marland Kitchen buPROPion (WELLBUTRIN XL) 150 MG 24 hr tablet Take 150 mg by mouth daily.    . Calcium Carb-Cholecalciferol (CALCIUM + D3 PO) Take 1 tablet by mouth.    Marland Kitchen glucosamine-chondroitin 500-400 MG tablet Take 1 tablet by mouth daily.    . Multiple Vitamin (MULTIVITAMIN WITH MINERALS) TABS tablet Take 1 tablet by mouth daily.    . Omega-3 Fatty Acids (FISH OIL PO) Take 2 capsules by mouth.    . TURMERIC PO Take by mouth.    . valACYclovir (VALTREX) 1000 MG tablet Take 1 tablet (1,000 mg total) by mouth 2 (two) times daily. 30 tablet 1   No current facility-administered medications for this visit.   Allergies: Morphine and related  No LMP recorded. Patient is postmenopausal.  Past medical history,surgical history, problem list, medications, allergies, family history and social history were all reviewed and documented as reviewed in the EPIC chart.  ROS:  Feeling well. No dyspnea or chest pain on exertion.  No abdominal pain, change in bowel habits, black or bloody stools.  No urinary tract symptoms. GYN ROS: no abnormal bleeding, pelvic pain or discharge, no breast pain or new or enlarging lumps on self exam.No neurological complaints.   OBJECTIVE:  BP 118/74   Ht 5\' 8"  (1.727 m)   Wt 139 lb (63 kg)   BMI 21.13 kg/m  The patient appears well, alert, oriented x 3, in no distress. ENT normal.  Neck supple. No cervical or supraclavicular adenopathy or thyromegaly.  Lungs are clear, good air entry, no wheezes, rhonchi or rales. S1 and S2 normal, no murmurs, regular rate and rhythm.  Abdomen soft without  tenderness, guarding, mass or organomegaly.  Neurological is normal, no focal findings.  BREAST EXAM: breasts appear normal, no suspicious masses, no skin or nipple changes or axillary nodes  PELVIC EXAM: VULVA: normal appearing vulva with no masses, tenderness or lesions, VAGINA: normal appearing vagina with normal color and discharge, no lesions, CERVIX: normal appearing cervix without discharge or lesions, UTERUS: uterus is normal size, shape, consistency and nontender, ADNEXA: normal adnexa in size, nontender and no masses, WET MOUNT done - results: negative for pathogens, normal epithelial cells  Chaperone: present during the examination. Kennon Portela (DNP student) present during the examination and performed the pelvic exam with me in attendance to confirm the exam findings   ASSESSMENT:  59 y.o. G2P0020 here for annual gynecologic exam  PLAN:   1. Postmenopausal.  No significant hot flashes or night sweats.  No vaginal bleeding. 2. Pap smear/HPV 2019.  No Pap smear done today.  No history of abnormal Pap smears.  Plan repeat Pap smear/HPV at 5-year interval per current screening guidelines. 3.  STD screen.  Patient did request GC/chlamydia screen and will screen for trichomonas based off wet mount.  Will report results to patient when available.  She already did blood work.  If she does want serum STD screening that can be done at a separate blood draw. 4. Mammogram 09/2019.  Normal breast exam today.  Continue with annual mammograms. 5. Colonoscopy 2021.  Recommended  that she follow up at the recommended interval.   6. DEXA never.  Will plan further into menopause. 7. Health maintenance.  We reviewed her recent routine screening blood work and all is normal, exception of chronically low HDL and she is very active physically.  Return annually or sooner, prn.  Theresia Majors MD 11/11/19

## 2019-11-13 LAB — C. TRACHOMATIS/N. GONORRHOEAE RNA
C. trachomatis RNA, TMA: NOT DETECTED
N. gonorrhoeae RNA, TMA: NOT DETECTED

## 2020-03-29 ENCOUNTER — Ambulatory Visit: Payer: BC Managed Care – PPO | Admitting: Obstetrics and Gynecology

## 2020-03-30 ENCOUNTER — Other Ambulatory Visit: Payer: Self-pay

## 2020-03-30 ENCOUNTER — Encounter: Payer: Self-pay | Admitting: Obstetrics and Gynecology

## 2020-03-30 ENCOUNTER — Ambulatory Visit (INDEPENDENT_AMBULATORY_CARE_PROVIDER_SITE_OTHER): Payer: BC Managed Care – PPO | Admitting: Obstetrics and Gynecology

## 2020-03-30 VITALS — BP 120/74

## 2020-03-30 DIAGNOSIS — N83202 Unspecified ovarian cyst, left side: Secondary | ICD-10-CM | POA: Diagnosis not present

## 2020-03-30 NOTE — Progress Notes (Signed)
   Cherylene Ferrufino [facility] [birth year] [id number]  SUBJECTIVE:  59 y.o. G2P0020 female presents for recommendations from incidental finding of a small simple left ovarian cyst on CT imaging performed at [facility] 03/16/2020 in preparation for her to donate a kidney.  She is not having any abdominal pain or menopausal bleeding.  CT imaging report from [facility] indicates a simple left adnexal cyst measuring 2.8 x 2.0 cm no pelvic lymphadenopathy noted (only the radiology report is available to view, images are not available for review).  Of note, the patient did have a CT of the abdomen & pelvis performed 09/18/2019 at [facility] she did not indicate any adnexal pathology on the reports (nor upon my personal review of the imaging).  Family history is notable for breast cancer in her mother at age 63 and she also had a hysterectomy in her 70s.   Current Outpatient Medications  Medication Sig Dispense Refill  . amoxicillin (AMOXIL) 500 MG capsule Take 500 mg by mouth 3 (three) times daily. Prior to dental procedures    . BIOTIN PO Take by mouth.    Marland Kitchen buPROPion (WELLBUTRIN XL) 150 MG 24 hr tablet Take 150 mg by mouth daily.    Marland Kitchen glucosamine-chondroitin 500-400 MG tablet Take 1 tablet by mouth daily.    . Multiple Vitamin (MULTIVITAMIN WITH MINERALS) TABS tablet Take 1 tablet by mouth daily.    . TURMERIC PO Take by mouth.    . valACYclovir (VALTREX) 1000 MG tablet Take 1 tablet (1,000 mg total) by mouth 2 (two) times daily. 30 tablet 1  . Acetylcarnitine HCl (ACETYL L-CARNITINE) 250 MG CAPS 4 capsules    . Calcium Carb-Cholecalciferol (CALCIUM + D3 PO) Take 1 tablet by mouth. (Patient not taking: Reported on 03/30/2020)    . Digestive Enzyme CAPS See admin instructions.    . Omega-3 Fatty Acids (FISH OIL PO) Take 2 capsules by mouth. (Patient not taking: Reported on 03/30/2020)     No current facility-administered medications for this visit.   Allergies: Morphine and related  No LMP recorded. Patient is  postmenopausal.  Past medical history,surgical history, problem list, medications, allergies, family history and social history were all reviewed and documented as reviewed in the EPIC chart.  ROS: Positives and negatives as reviewed in HPI.  OBJECTIVE:  BP 120/74 (BP Location: Left Arm, Patient Position: Sitting, Cuff Size: Large)  The patient appears well, alert, oriented, in no distress. PELVIC EXAM: Deferred   ASSESSMENT:  59 y.o. G2P0020 here for advice regarding incidental finding of a small simple left ovarian cyst identified on CT imaging  PLAN:  Discussed differential diagnosis includes escape ovulation cyst, benign neoplasm, or less likely malignancy.  To get better evaluation of the internal details of the cyst and to confirm it is indeed simple, I recommend that she get a pelvic ultrasound which she will plan to schedule.  If it is just a simple cyst then I would recommend next ultrasound follow-up in 3 to 6 months, if there are significant complex internal components then may consider getting tumor markers as an adjunct to our evaluation.   Theresia Majors MD 03/30/20

## 2020-04-14 ENCOUNTER — Other Ambulatory Visit: Payer: BC Managed Care – PPO

## 2020-04-14 ENCOUNTER — Ambulatory Visit: Payer: BC Managed Care – PPO | Admitting: Obstetrics and Gynecology

## 2020-05-12 ENCOUNTER — Other Ambulatory Visit: Payer: BC Managed Care – PPO

## 2020-05-12 ENCOUNTER — Other Ambulatory Visit: Payer: BC Managed Care – PPO | Admitting: Obstetrics and Gynecology

## 2020-05-26 ENCOUNTER — Encounter: Payer: Self-pay | Admitting: Obstetrics and Gynecology

## 2020-05-26 ENCOUNTER — Ambulatory Visit (INDEPENDENT_AMBULATORY_CARE_PROVIDER_SITE_OTHER): Payer: BC Managed Care – PPO | Admitting: Obstetrics and Gynecology

## 2020-05-26 ENCOUNTER — Other Ambulatory Visit: Payer: Self-pay

## 2020-05-26 ENCOUNTER — Ambulatory Visit (INDEPENDENT_AMBULATORY_CARE_PROVIDER_SITE_OTHER): Payer: BC Managed Care – PPO

## 2020-05-26 VITALS — BP 114/62 | HR 59

## 2020-05-26 DIAGNOSIS — N83201 Unspecified ovarian cyst, right side: Secondary | ICD-10-CM | POA: Diagnosis not present

## 2020-05-26 DIAGNOSIS — N83202 Unspecified ovarian cyst, left side: Secondary | ICD-10-CM

## 2020-05-26 DIAGNOSIS — N7011 Chronic salpingitis: Secondary | ICD-10-CM | POA: Diagnosis not present

## 2020-05-26 NOTE — Progress Notes (Signed)
[facility] September 15, 1960 [id number]  SUBJECTIVE:  60 y.o. G2P0020 female presents for further evaluation of a left ovarian cyst that it was incidentally discovered on CT imaging performed at [facility] on 03/16/2020 in preparation for her to donate a kidney.  There were no visible cysts when she had a CT of the abdomen pelvis on 09/18/2019 at [facility].  She has been asymptomatic without abdominal pain.  She does not have any vaginal bleeding.  Pertinent family history includes mother with breast cancer onset age 65.   Current Outpatient Medications  Medication Sig Dispense Refill  . Acetylcarnitine HCl (ACETYL L-CARNITINE) 250 MG CAPS 4 capsules    . amoxicillin (AMOXIL) 500 MG capsule Take 500 mg by mouth 3 (three) times daily. Prior to dental procedures    . BIOTIN PO Take by mouth.    Marland Kitchen buPROPion (WELLBUTRIN XL) 150 MG 24 hr tablet Take 150 mg by mouth daily.    . Calcium Carb-Cholecalciferol (CALCIUM + D3 PO) Take 1 tablet by mouth. (Patient not taking: Reported on 03/30/2020)    . Digestive Enzyme CAPS See admin instructions.    Marland Kitchen glucosamine-chondroitin 500-400 MG tablet Take 1 tablet by mouth daily.    . Multiple Vitamin (MULTIVITAMIN WITH MINERALS) TABS tablet Take 1 tablet by mouth daily.    . Omega-3 Fatty Acids (FISH OIL PO) Take 2 capsules by mouth. (Patient not taking: Reported on 03/30/2020)    . TURMERIC PO Take by mouth.    . valACYclovir (VALTREX) 1000 MG tablet Take 1 tablet (1,000 mg total) by mouth 2 (two) times daily. 30 tablet 1   No current facility-administered medications for this visit.   Allergies: Morphine and related  No LMP recorded. Patient is postmenopausal.  Past medical history,surgical history, problem list, medications, allergies, family history and social history were all reviewed and documented as reviewed in the EPIC chart.   OBJECTIVE:  There were no vitals taken for this visit. The patient appears well, alert, oriented, in no distress. PELVIC EXAM:  Deferred  Pelvic ultrasound  Anteverted uterus 5.1 x 3.2 x 1.7 cm No myometrial masses. Thin and symmetric endometrium 1.2 mm. Left ovary with thin-walled simple cyst 2.6 x 2.1 cm (consistent with outside CT imaging) Right ovary with 2 simple cysts 3.3 x 2.3 and 3.3 x 2.3 cm.  Right hydrosalpinx 1.8 x 0.9 x 1.4 cm. No other adnexal masses.  Scant clear simple free fluid near the right adnexa, likely physiologic.   ASSESSMENT:  60 y.o. G2P0020 with bilateral simple ovarian cysts and a right hydrosalpinx, asymptomatic  PLAN:  We discussed the images and I showed her the pictures taken during the ultrasound today demonstrating the simple left ovarian cyst and the simple right ovarian cysts and hydrosalpinx.  The left ovarian cyst was visualized on CT, but the right cysts were not.  We discussed limited detection and detail of adnexal pathology with CT imaging.  These could just be some simple cyst remnants or might represent benign tumors.  They do have a simple appearance.  At this point they are very small and she remains asymptomatic. I recommended that she come back in about 3-4 months for repeat pelvic ultrasound to ensure the cysts and hydrosalpinx remain stable in size.  If any changes or appearance of internal complexity then would recommend tumor markers in that case.  Repeat pelvic ultrasound is ordered. The patient is aware that I will only be at this practice until early March 2022 so she knows to  make sure she requests follow-up when I am no longer at the practice.   Theresia Majors MD 05/26/20

## 2020-07-20 ENCOUNTER — Telehealth: Payer: Self-pay | Admitting: *Deleted

## 2020-07-20 DIAGNOSIS — Z01419 Encounter for gynecological examination (general) (routine) without abnormal findings: Secondary | ICD-10-CM

## 2020-07-20 DIAGNOSIS — Z1329 Encounter for screening for other suspected endocrine disorder: Secondary | ICD-10-CM

## 2020-07-20 DIAGNOSIS — Z13220 Encounter for screening for lipoid disorders: Secondary | ICD-10-CM

## 2020-07-20 DIAGNOSIS — Z1321 Encounter for screening for nutritional disorder: Secondary | ICD-10-CM

## 2020-07-20 NOTE — Telephone Encounter (Signed)
Patient has annual exam scheduled on 11/11/20, would like to have labs done on 10/31/20. Patient asked if you could place labs orders. Please advise

## 2020-07-21 NOTE — Telephone Encounter (Signed)
Patient informed, lab orders placed. 

## 2020-08-18 ENCOUNTER — Other Ambulatory Visit: Payer: Self-pay | Admitting: Obstetrics and Gynecology

## 2020-08-18 DIAGNOSIS — Z1231 Encounter for screening mammogram for malignant neoplasm of breast: Secondary | ICD-10-CM

## 2020-09-15 ENCOUNTER — Other Ambulatory Visit: Payer: Self-pay

## 2020-09-15 ENCOUNTER — Ambulatory Visit (INDEPENDENT_AMBULATORY_CARE_PROVIDER_SITE_OTHER): Payer: BC Managed Care – PPO

## 2020-09-15 ENCOUNTER — Ambulatory Visit (INDEPENDENT_AMBULATORY_CARE_PROVIDER_SITE_OTHER): Payer: BC Managed Care – PPO | Admitting: Obstetrics & Gynecology

## 2020-09-15 VITALS — BP 110/64

## 2020-09-15 DIAGNOSIS — N7011 Chronic salpingitis: Secondary | ICD-10-CM

## 2020-09-15 DIAGNOSIS — N83202 Unspecified ovarian cyst, left side: Secondary | ICD-10-CM

## 2020-09-15 DIAGNOSIS — N83201 Unspecified ovarian cyst, right side: Secondary | ICD-10-CM

## 2020-09-15 NOTE — Progress Notes (Signed)
    Christine Rangel [facility] Aug 14, 1960 [id number]        60 y.o.  G2P0020   RP:  Bilateral Ovarian cysts for f/u Pelvic US  HPI: No pelvic pain.  Postmenopausal well on no HRT.   Left ovarian cyst incidentally discovered on CT imaging performed at [facility] on 03/16/2020 in preparation for her to donate a kidney.  Pelvic US here with Dr Penni Bombard 05/2020 showing bilateral simple ovarian cysts and a left hydrosalpinx.   OB History  Gravida Para Term Preterm AB Living  2       2 0  SAB IAB Ectopic Multiple Live Births  2            # Outcome Date GA Lbr Len/2nd Weight Sex Delivery Anes PTL Lv  2 SAB           1 SAB             Past medical history,surgical history, problem list, medications, allergies, family history and social history were all reviewed and documented in the EPIC chart.   Directed ROS with pertinent positives and negatives documented in the history of present illness/assessment and plan.  Exam:  Vitals:   09/15/20 1529  BP: 110/64   General appearance:  Normal  Pelvic US today: T/V images.  Anteverted uterus normal in size and shape with no myometrial mass.  The uterus is measured at 5.22 x 3.55 x 1.56 cm.  The endometrial lining is thin and symmetrical with no mass or thickening seen, measured at 0.85 mm.  The right ovary presents a 9 mm residual cyst with thin walls, avascular, echo-free (previously measured at 22 mm).  The right adnexa presents a hydrosalpinx measured at 7.6 x 1.6 cm, avascular, thick walled.  The left ovary presents a cyst measured at 2.6 x 2.7 cm with thin walls, echo-free and avascular.  No adnexal mass otherwise.  No free fluid in the posterior cul-de-sac.   Assessment/Plan:  60 y.o. G2P0020   1. Cyst of ovary, left Very small simple benign appearing left ovarian cyst measured at 2.7 cm, stable compared to previous ultrasound.  No free fluid in the posterior cul-de-sac. Patient reassured.    2. Cyst of ovary, right Very small benign remnant follicle on  the right ovary, decreased in size compared to the previous ultrasound.  Patient reassured.  3. Hydrosalpinx  Left hydrosalpinx still present, benign appearance.  Patient reassured.  Genia Del MD, 3:39 PM 09/15/2020

## 2020-09-16 ENCOUNTER — Encounter: Payer: Self-pay | Admitting: Obstetrics & Gynecology

## 2020-10-14 ENCOUNTER — Other Ambulatory Visit: Payer: Self-pay

## 2020-10-14 ENCOUNTER — Ambulatory Visit
Admission: RE | Admit: 2020-10-14 | Discharge: 2020-10-14 | Disposition: A | Discharge status: Home / Self Care | Payer: BC Managed Care – PPO | Source: Ambulatory Visit | Attending: Obstetrics and Gynecology | Admitting: Obstetrics and Gynecology

## 2020-10-14 DIAGNOSIS — Z1231 Encounter for screening mammogram for malignant neoplasm of breast: Secondary | ICD-10-CM

## 2020-10-31 ENCOUNTER — Other Ambulatory Visit: Payer: BC Managed Care – PPO

## 2020-11-11 ENCOUNTER — Ambulatory Visit (INDEPENDENT_AMBULATORY_CARE_PROVIDER_SITE_OTHER): Payer: BC Managed Care – PPO | Admitting: Obstetrics & Gynecology

## 2020-11-11 ENCOUNTER — Other Ambulatory Visit (HOSPITAL_COMMUNITY)
Admission: RE | Admit: 2020-11-11 | Discharge: 2020-11-11 | Disposition: A | Discharge status: Home / Self Care | Payer: BC Managed Care – PPO | Source: Ambulatory Visit | Attending: Obstetrics and Gynecology | Admitting: Obstetrics and Gynecology

## 2020-11-11 ENCOUNTER — Other Ambulatory Visit: Payer: Self-pay

## 2020-11-11 ENCOUNTER — Encounter: Payer: BC Managed Care – PPO | Admitting: Obstetrics and Gynecology

## 2020-11-11 ENCOUNTER — Encounter: Payer: Self-pay | Admitting: Obstetrics & Gynecology

## 2020-11-11 VITALS — BP 110/72 | HR 64 | Resp 16 | Ht 66.5 in | Wt 151.0 lb

## 2020-11-11 DIAGNOSIS — Z01419 Encounter for gynecological examination (general) (routine) without abnormal findings: Secondary | ICD-10-CM

## 2020-11-11 DIAGNOSIS — Z780 Asymptomatic menopausal state: Secondary | ICD-10-CM | POA: Diagnosis not present

## 2020-11-11 NOTE — Progress Notes (Signed)
Christine Rangel [facility] July 08, 1960 [id number]   History:    60 y.o. G2P0A2  RP:  Established patient presenting for annual gyn exam    HPI: Postmenopause, well on no HRT.  No PMB.  No pelvic pain.  Left ovarian cyst incidentally discovered on CT imaging performed at [facility] on 03/16/2020 in preparation for her to donate a kidney.  Pelvic US here 09/15/2020 was stable c/w [place] 05/2020 with small simple bilateral ovarian follicle remnants and a Left Hydrosalpinx.  Breasts normal.  BMI 24.01.  Good fitness and nutrition.  Health labs with Fam MD.    Past medical history,surgical history, family history and social history were all reviewed and documented in the EPIC chart.  Gynecologic History No LMP recorded. Patient is postmenopausal.  Obstetric History OB History  Gravida Para Term Preterm AB Living  2       2 0  SAB IAB Ectopic Multiple Live Births  2            # Outcome Date GA Lbr Len/2nd Weight Sex Delivery Anes PTL Lv  2 SAB           1 SAB              ROS: A ROS was performed and pertinent positives and negatives are included in the history.  GENERAL: No fevers or chills. HEENT: No change in vision, no earache, sore throat or sinus congestion. NECK: No pain or stiffness. CARDIOVASCULAR: No chest pain or pressure. No palpitations. PULMONARY: No shortness of breath, cough or wheeze. GASTROINTESTINAL: No abdominal pain, nausea, vomiting or diarrhea, melena or bright red blood per rectum. GENITOURINARY: No urinary frequency, urgency, hesitancy or dysuria. MUSCULOSKELETAL: No joint or muscle pain, no back pain, no recent trauma. DERMATOLOGIC: No rash, no itching, no lesions. ENDOCRINE: No polyuria, polydipsia, no heat or cold intolerance. No recent change in weight. HEMATOLOGICAL: No anemia or easy bruising or bleeding. NEUROLOGIC: No headache, seizures, numbness, tingling or weakness. PSYCHIATRIC: No depression, no loss of interest in normal activity or change in sleep pattern.      Exam:   BP 110/72   Pulse 64   Resp 16   Ht 5' 6.5" (1.689 m)   Wt 151 lb (68.5 kg)   BMI 24.01 kg/m   Body mass index is 24.01 kg/m.  General appearance : Well developed well nourished female. No acute distress HEENT: Eyes: no retinal hemorrhage or exudates,  Neck supple, trachea midline, no carotid bruits, no thyroidmegaly Lungs: Clear to auscultation, no rhonchi or wheezes, or rib retractions  Heart: Regular rate and rhythm, no murmurs or gallops Breast:Examined in sitting and supine position were symmetrical in appearance, no palpable masses or tenderness,  no skin retraction, no nipple inversion, no nipple discharge, no skin discoloration, no axillary or supraclavicular lymphadenopathy Abdomen: no palpable masses or tenderness, no rebound or guarding Extremities: no edema or skin discoloration or tenderness  Pelvic: Vulva: Normal             Vagina: No gross lesions or discharge  Cervix: No gross lesions or discharge.  Pap reflex done.  Uterus  AV, normal size, shape and consistency, non-tender and mobile  Adnexa  Without masses or tenderness  Anus: Normal  Pelvic US 09/15/2020:  T/V images.  Anteverted uterus normal in size and shape with no myometrial mass.  The uterus is measured at 5.22 x 3.55 x 1.56 cm.  The endometrial lining is thin and symmetrical with no mass or thickening  seen, measured at 0.85 mm.  The right ovary presents a 9 mm residual cyst with thin walls, avascular, echo-free (previously measured at 22 mm).  The right adnexa presents a hydrosalpinx measured at 7.6 x 1.6 cm, avascular, thick walled.  The left ovary presents a cyst measured at 2.6 x 2.7 cm with thin walls, echo-free and avascular.  No adnexal mass otherwise.  No free fluid in the posterior cul-de-sac.     Assessment/Plan:  60 y.o. female for annual exam   1. Encounter for routine gynecological examination with Papanicolaou smear of cervix Normal gynecologic exam in menopause.  Pap reflex  done.  Breast exam normal.  Screening mammogram July 2022 was negative.  Colonoscopy 2021.  Health labs with family physician.  Body mass index 20.01.  Continue with fitness and healthy nutrition. - Cytology - PAP( [place])  2. Postmenopause Well on no hormone replacement therapy.  No postmenopausal bleeding.  Other orders - Coenzyme Q10 (COQ10 PO); Take by mouth. - Cyanocobalamin (B-12 PO); Take by mouth.   Genia Del MD, 10:43 AM 11/11/2020

## 2020-11-13 ENCOUNTER — Encounter: Payer: Self-pay | Admitting: Obstetrics & Gynecology

## 2020-11-14 LAB — CYTOLOGY - PAP: Diagnosis: NEGATIVE

## 2021-06-06 IMAGING — CT CT ABD-PELV W/ CM
2 of 5 series · 16 of 46 positions shown, 18 images · IV contrast (Omnipaque)
Comparison: None.

CLINICAL DATA: Constipation

EXAM:
CT ABDOMEN AND PELVIS WITH CONTRAST
TECHNIQUE: Multidetector CT imaging of the abdomen and pelvis was performed
using the standard protocol following bolus administration of
intravenous contrast.
CONTRAST:  100mL OMNIPAQUE IOHEXOL 300 MG/ML  SOLN

[Series 2: axial st · axial · 0.63mm/px · z∈[-469,-89]mm · 13 of 86 slices shown, 15 images]
[im 5/86  soft-tissue]
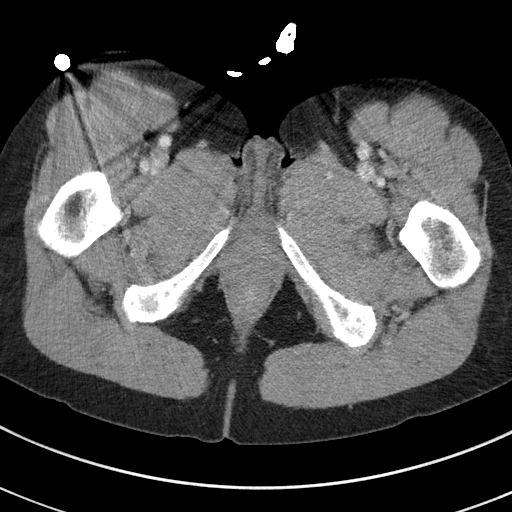
[im 5/86  bone]
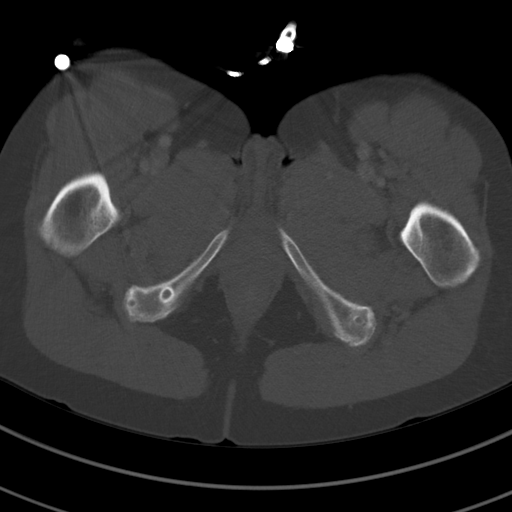
[im 14/86  soft-tissue]
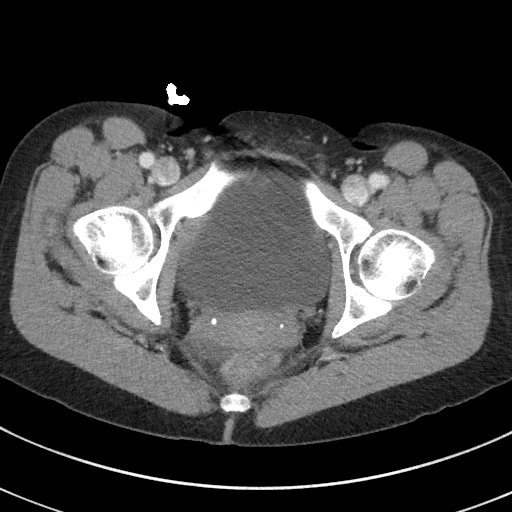
[im 18/86  soft-tissue]
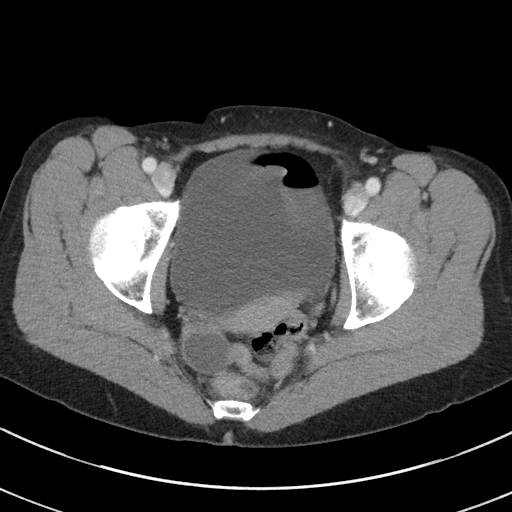
[im 23/86  soft-tissue]
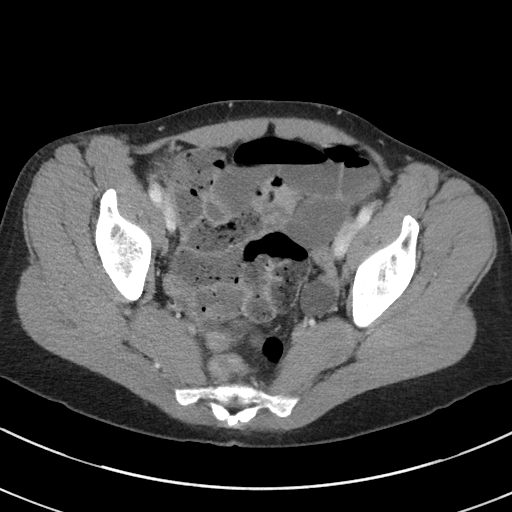
[im 32/86  soft-tissue]
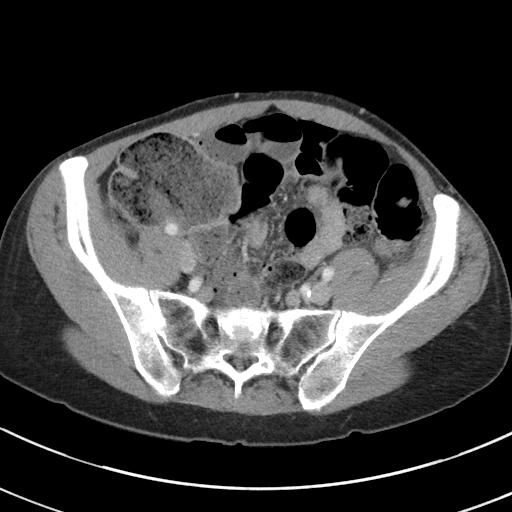
[im 36/86  soft-tissue]
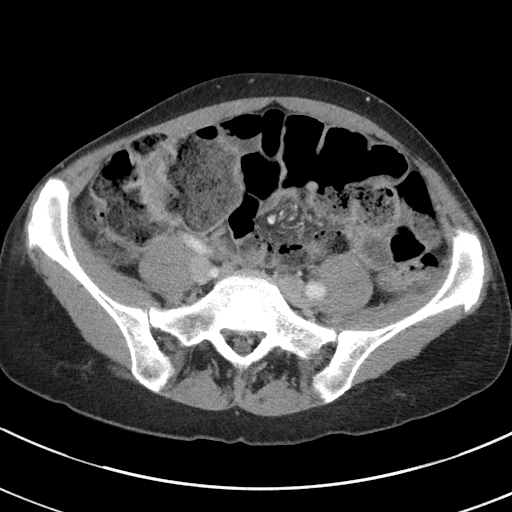
[im 45/86  soft-tissue]
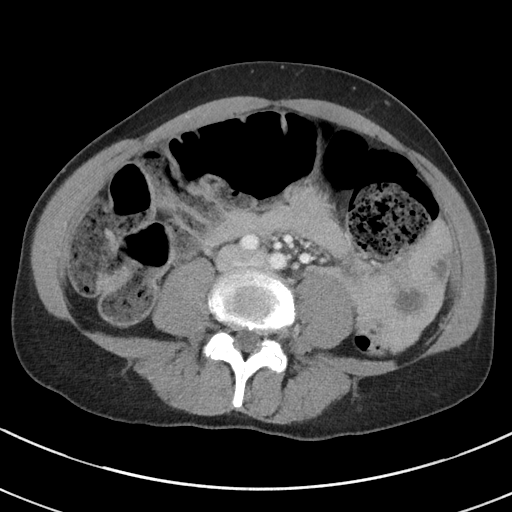
[im 50/86  soft-tissue]
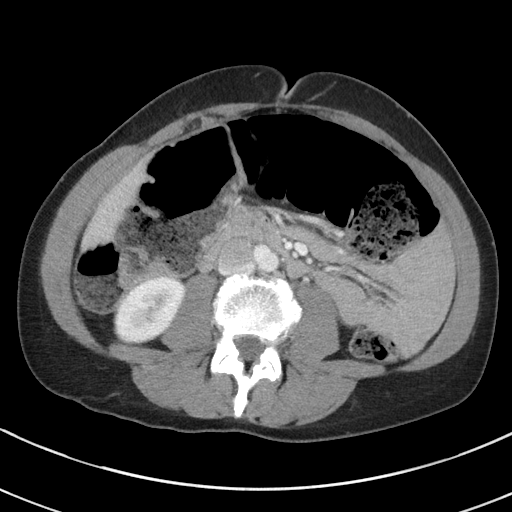
[im 54/86  soft-tissue]
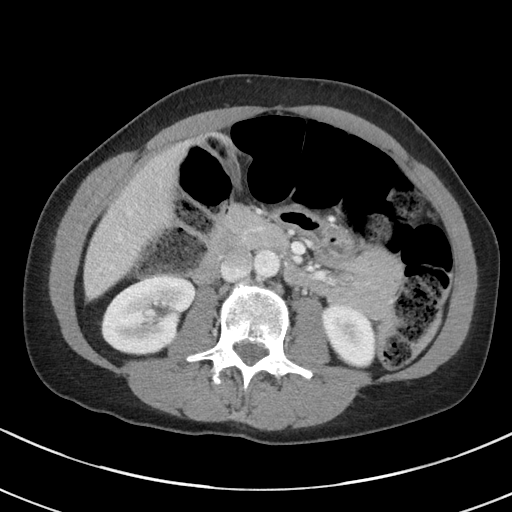
[im 54/86  bone]
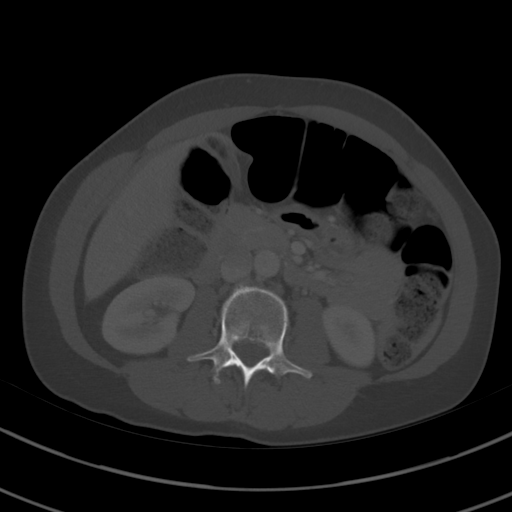
[im 63/86  soft-tissue]
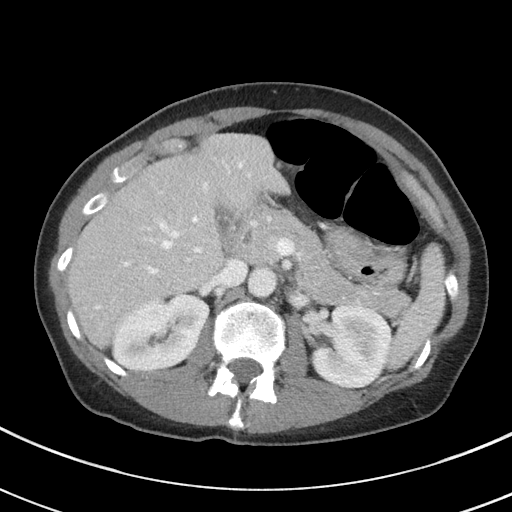
[im 68/86  soft-tissue]
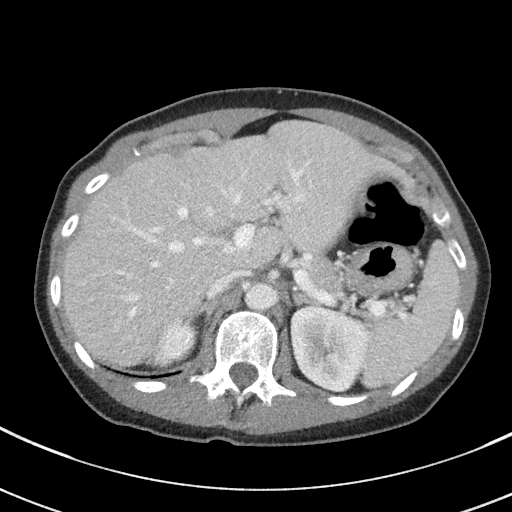
[im 72/86  soft-tissue]
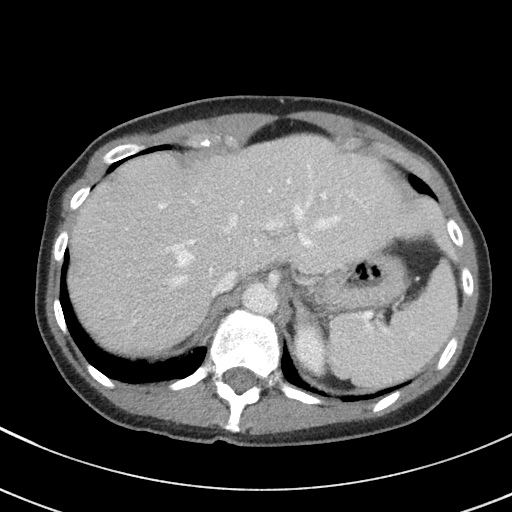
[im 81/86  soft-tissue]
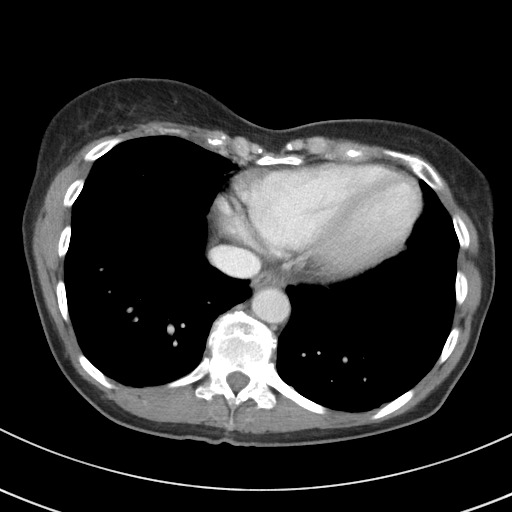

[Series 5: coronal st · coronal · 0.67mm/px · 3 of 78 slices shown]
[im 26/78  soft-tissue]
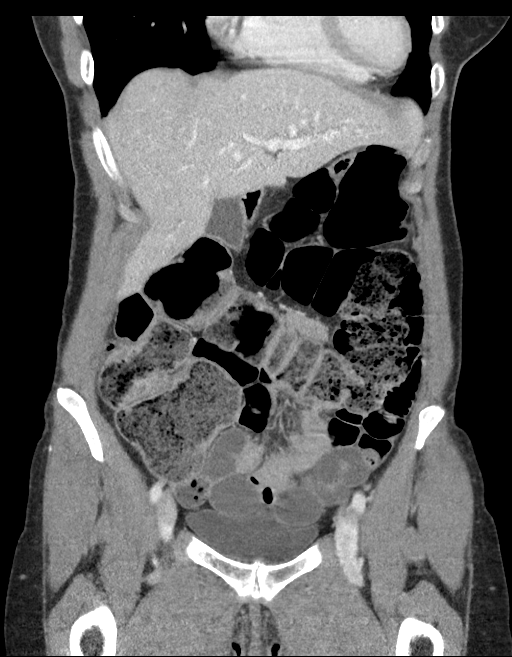
[im 35/78  soft-tissue]
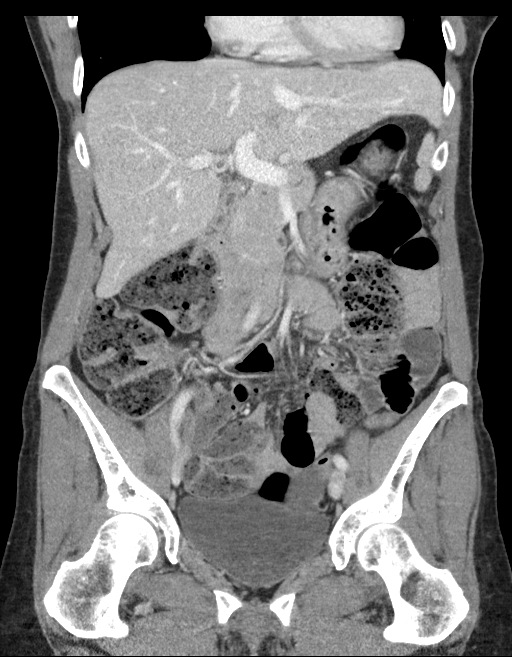
[im 43/78  soft-tissue]
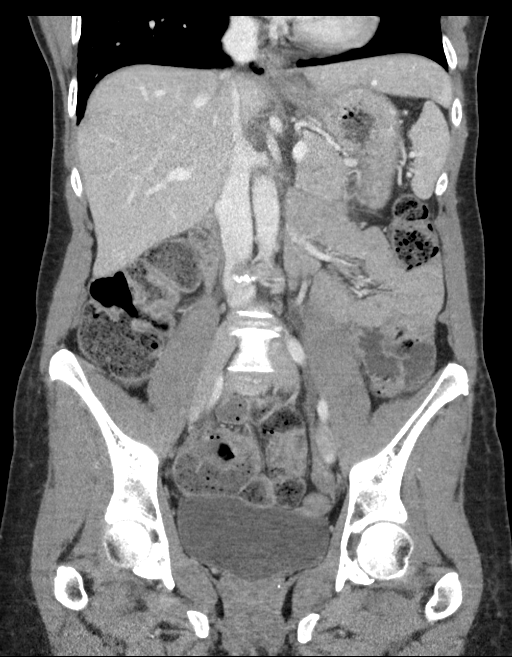

[16 of 46 positions shown; findings below may reference images not displayed]

FINDINGS: Lower chest: The lung bases are clear. The heart size is normal.

Hepatobiliary: The liver is normal. Normal gallbladder.There is no
biliary ductal dilation.

Pancreas: Normal contours without ductal dilatation. No
peripancreatic fluid collection.

Spleen: Unremarkable.

Adrenals/Urinary Tract:

--Adrenal glands: Unremarkable.

--Right kidney/ureter: No hydronephrosis or radiopaque kidney
stones.

--Left kidney/ureter: No hydronephrosis or radiopaque kidney stones.

--Urinary bladder: Unremarkable.

Stomach/Bowel:

--Stomach/Duodenum: No hiatal hernia or other gastric abnormality.
Normal duodenal course and caliber.

--Small bowel: There is stasis in the distal small bowel.

--Colon: There is a very large amount of stool in the colon. The
rectum, sigmoid colon, and distal descending colon are relatively
decompressed. There is no evidence for an obstructing mass.

--Appendix: Normal.

Vascular/Lymphatic: Atherosclerotic calcification is present within
the non-aneurysmal abdominal aorta, without hemodynamically
significant stenosis.

--No retroperitoneal lymphadenopathy.

--No mesenteric lymphadenopathy.

--No pelvic or inguinal lymphadenopathy.

Reproductive: Unremarkable

Other: No ascites or free air. The abdominal wall is normal.

Musculoskeletal. No acute displaced fractures.
IMPRESSION: Very large stool burden. No evidence for an obstructing mass. No
small bowel obstruction.

## 2021-06-06 IMAGING — DX DG ABDOMEN 1V
1 series · 1 of 1 positions shown · non-contrast
Comparison: None.

CLINICAL DATA: Constipation

EXAM:
ABDOMEN - 1 VIEW

[abdomen kub]
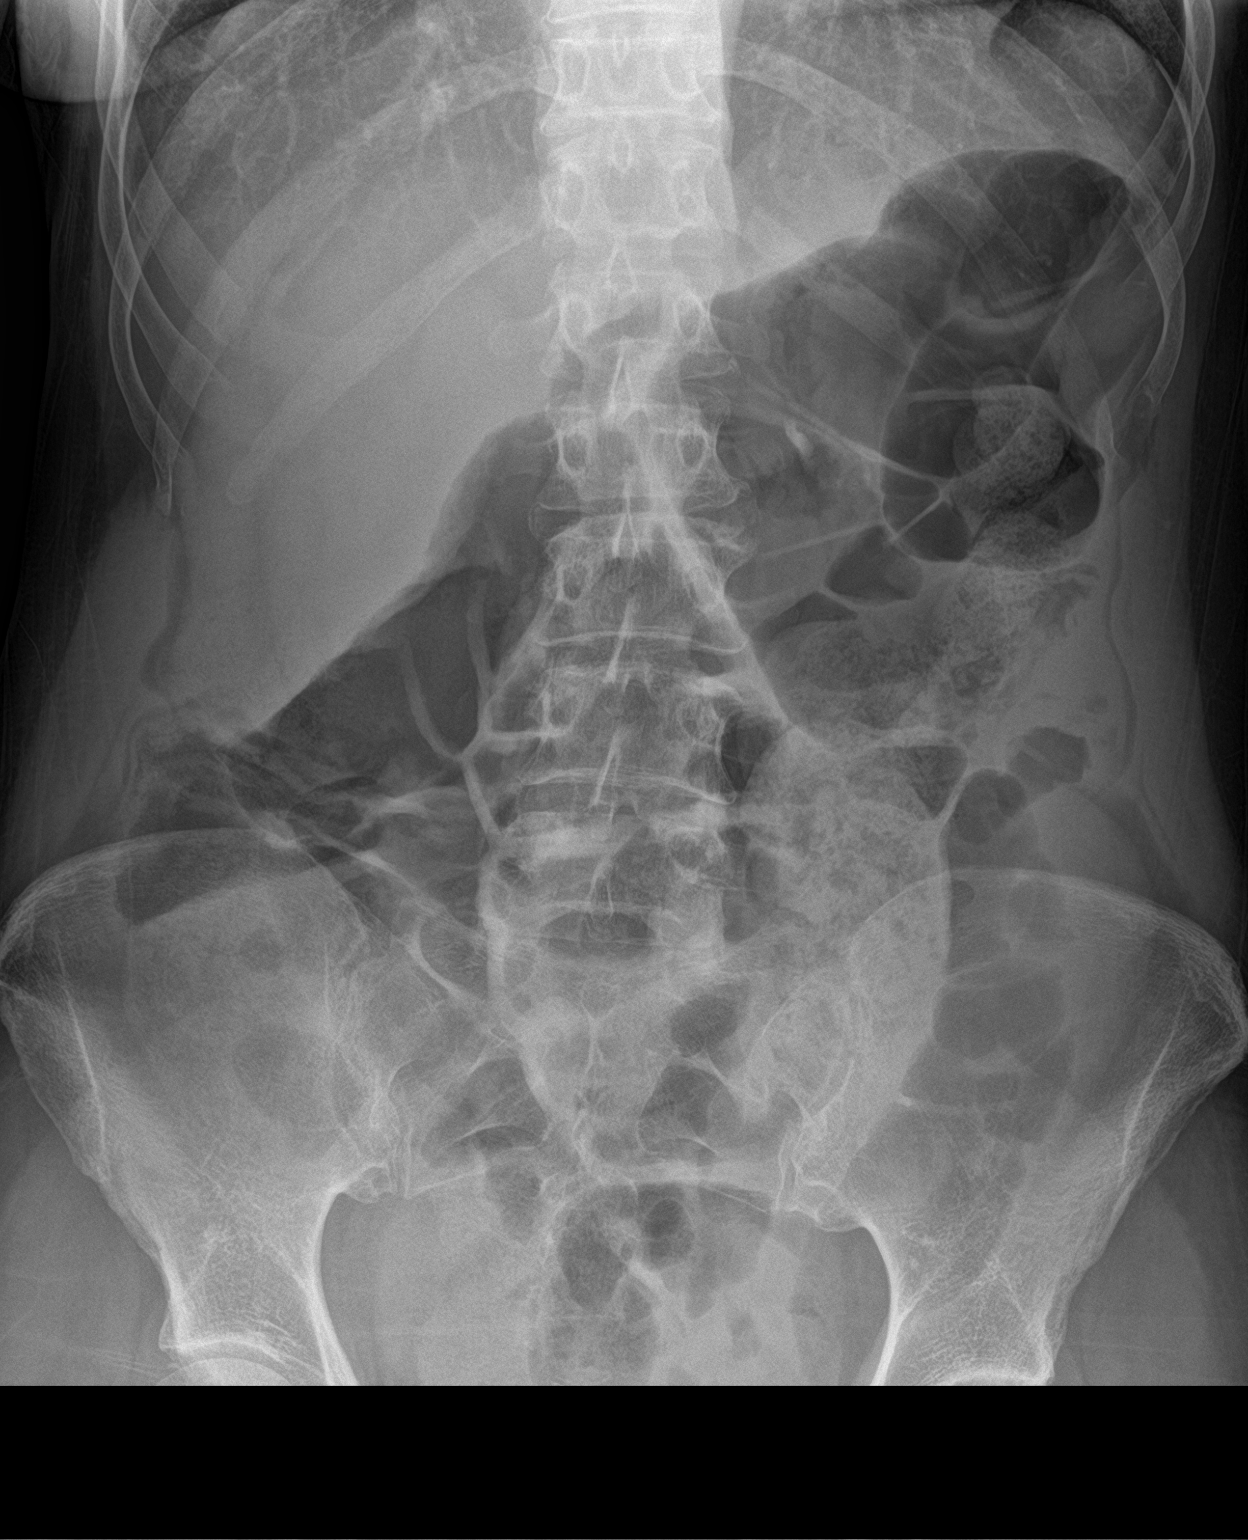

[1 of 1 positions shown; findings below may reference images not displayed]

FINDINGS: Mild gaseous distention of bowel diffusely. Moderate stool burden.
No organomegaly, free air or suspicious calcification. No acute bony
abnormality.
IMPRESSION: Moderate stool burden with mild diffuse gaseous distention of bowel
which may reflect mild ileus.

## 2021-06-26 ENCOUNTER — Ambulatory Visit (INDEPENDENT_AMBULATORY_CARE_PROVIDER_SITE_OTHER): Payer: BC Managed Care – PPO | Admitting: Podiatry

## 2021-06-26 ENCOUNTER — Ambulatory Visit (INDEPENDENT_AMBULATORY_CARE_PROVIDER_SITE_OTHER): Payer: BC Managed Care – PPO

## 2021-06-26 ENCOUNTER — Other Ambulatory Visit: Payer: Self-pay

## 2021-06-26 DIAGNOSIS — M722 Plantar fascial fibromatosis: Secondary | ICD-10-CM

## 2021-06-26 DIAGNOSIS — M7751 Other enthesopathy of right foot: Secondary | ICD-10-CM | POA: Diagnosis not present

## 2021-06-26 MED ORDER — TRIAMCINOLONE ACETONIDE 10 MG/ML IJ SUSP
10.0000 mg | Freq: Once | INTRAMUSCULAR | Status: AC
  Administered 2021-06-26: 10 mg

## 2021-06-26 NOTE — Progress Notes (Signed)
SITUATION ?Reason for Consult: Evaluation for Bilateral Custom Foot Orthoses ?Patient / Caregiver Report: Patient is ready for foot orthotics ? ?OBJECTIVE DATA: ?Patient History / Diagnosis:  ?  ICD-10-CM   ?1. Plantar fasciitis  M72.2   ?  ?2. Capsulitis of metatarsophalangeal (MTP) joint of right foot  M77.51   ?  ? ? ?Current or Previous Devices:   Prefab only ? ?Foot Examination: ?Skin presentation:   Intact ?Ulcers & Callousing:   Callusing on 1st met head and hallux bilateral ?Toe / Foot Deformities:  Pes cavus, prominent met heads ?Weight Bearing Presentation:  Cavus ?Sensation:    Intact ? ?Shoe Size:    9.5 ? ?ORTHOTIC RECOMMENDATION ?Recommended Device: 1x pair of custom functional foot orthotics ? ?GOALS OF ORTHOSES ?- Reduce Pain ?- Prevent Foot Deformity ?- Prevent Progression of Further Foot Deformity ?- Relieve Pressure ?- Improve the Overall Biomechanical Function of the Foot and Lower Extremity. ? ?ACTIONS PERFORMED ?Potential out of pocket cost was communicated to patient. Patient understood and consent to casting. Patient was casted for Foot Orthoses via crush box. Procedure was explained and patient tolerated procedure well. Casts were shipped to central fabrication. All questions were answered and concerns addressed. ? ?PLAN ?Patient is to be called for fitting when devices are ready.  ? ? ?

## 2021-06-26 NOTE — Progress Notes (Signed)
Subjective:  ? ?Patient ID: Christine Rangel, female   DOB: 61 y.o.   MRN: [id number]  ? ?HPI ?Patient presents stating she is having pain in the bottom of her right foot and has had history of left and right foot and is very active and is doing a 20 mile hike in the fall and is trying to increase her hiking.  Patient states it is hard for her to walk distances and she did do a 10 mile walk and the pain started again after that and does not smoke ? ? ?Review of Systems  ?All other systems reviewed and are negative. ? ? ?   ?Objective:  ?Physical Exam ?Vitals and nursing note reviewed.  ?Constitutional:   ?   Appearance: She is well-developed.  ?Pulmonary:  ?   Effort: Pulmonary effort is normal.  ?Musculoskeletal:     ?   General: Normal range of motion.  ?Skin: ?   General: Skin is warm.  ?Neurological:  ?   Mental Status: She is alert.  ?  ?Neurovascular status intact muscle strength adequate range of motion within normal limits with the patient noted to have a relative plantarflexed metatarsal right and the left with pressure against the first metatarsal keratotic tissue formation and inflammation around the tibial sesamoid right with moderate fluid buildup.  Patient is found to have good digital perfusion well oriented x3 ? ?   ?Assessment:  ?Acute inflammatory sesamoiditis with plantar capsulitis right first MPJ with structure which lends itself towards this condition with plantarflexion of the metatarsal noted ? ?   ?Plan:  ?H&P x-rays reviewed discussed careful steroid injection administered plantar capsule right first MPJ 3 mg dexamethasone Kenalog 5 mg Xylocaine dispensed dancers pads with instructions and casted for functional orthotics to offload the first metatarsal.  Patient will be seen back to pick them up will see me as needed and I did explain the position of the metatarsal as part of her problem ? ?X-rays indicate bipartite sesamoid bilateral with probability of no acute fracture given the type of pain  that she is experiencing and the way it presents ?   ? ? ?

## 2021-07-28 ENCOUNTER — Ambulatory Visit: Payer: BC Managed Care – PPO

## 2021-07-28 DIAGNOSIS — M722 Plantar fascial fibromatosis: Secondary | ICD-10-CM

## 2021-07-28 DIAGNOSIS — M7751 Other enthesopathy of right foot: Secondary | ICD-10-CM

## 2021-07-28 NOTE — Progress Notes (Signed)
SITUATION: ?Reason for Visit: Fitting and Delivery of Custom Fabricated Foot Orthoses ?Patient Report: Patient reports comfort and is satisfied with device. ? ?OBJECTIVE DATA: ?Patient History / Diagnosis:   ?  ICD-10-CM   ?1. Capsulitis of metatarsophalangeal (MTP) joint of right foot  M77.51   ?  ?2. Plantar fasciitis  M72.2   ?  ? ? ?Provided Device:  Custom Functional Foot Orthotics ?    [employer]: [id number] ? ?GOAL OF ORTHOSIS ?- Improve gait ?- Decrease energy expenditure ?- Improve Balance ?- Provide Triplanar stability of foot complex ?- Facilitate motion ? ?ACTIONS PERFORMED ?Patient was fit with foot orthotics trimmed to shoe last. Patient tolerated fittign procedure.  ? ?Patient was provided with verbal and written instruction and demonstration regarding donning, doffing, wear, care, proper fit, function, purpose, cleaning, and use of the orthosis and in all related precautions and risks and benefits regarding the orthosis. ? ?Patient was also provided with verbal instruction regarding how to report any failures or malfunctions of the orthosis and necessary follow up care. Patient was also instructed to contact our office regarding any change in status that may affect the function of the orthosis. ? ?Patient demonstrated independence with proper donning, doffing, and fit and verbalized understanding of all instructions. ? ?PLAN: ?Patient is to follow up in one week or as necessary (PRN). All questions were answered and concerns addressed. Plan of care was discussed with and agreed upon by the patient. ? ?

## 2021-07-31 DIAGNOSIS — Z9889 Other specified postprocedural states: Secondary | ICD-10-CM

## 2021-07-31 HISTORY — DX: Other specified postprocedural states: Z98.890

## 2021-08-23 NOTE — Patient Instructions (Addendum)
DUE TO COVID-19 ONLY TWO VISITORS  (aged 60 and older)  ARE ALLOWED TO COME WITH YOU AND STAY IN THE WAITING ROOM ONLY DURING PRE OP AND PROCEDURE.   **NO VISITORS ARE ALLOWED IN THE SHORT STAY AREA OR RECOVERY ROOM!!**  IF YOU WILL BE ADMITTED INTO THE HOSPITAL YOU ARE ALLOWED ONLY FOUR SUPPORT PEOPLE DURING VISITATION HOURS ONLY (7 AM -8PM)   The support person(s) must pass our screening, gel in and out, and wear a mask at all times, including in the patient's room. Patients must also wear a mask when staff or their support person are in the room. Visitors GUEST BADGE MUST BE WORN VISIBLY  One adult visitor may remain with you overnight and MUST be in the room by 8 P.M.     Your procedure is scheduled on: 09/07/21   Report to [facility] Main Entrance    Report to admitting at : 5:15 AM   Call this number if you have problems the morning of surgery [phone]   Do not eat food :After Midnight.   After Midnight you may have the following liquids until : 4:30 AM DAY OF SURGERY  Water Black Coffee (sugar ok, NO MILK/CREAM OR CREAMERS)  Tea (sugar ok, NO MILK/CREAM OR CREAMERS) regular and decaf                             Plain Jell-O (NO RED)                                           Fruit ices (not with fruit pulp, NO RED)                                     Popsicles (NO RED)                                                                  Juice: apple, WHITE grape, WHITE cranberry Sports drinks like Gatorade (NO RED) Clear broth(vegetable,chicken,beef)  Drink Ensure drink AT :4:30 AM the day of surgery.     The day of surgery:  Drink ONE (1) Pre-Surgery Clear Ensure or G2 at AM the morning of surgery. Drink in one sitting. Do not sip.  This drink was given to you during your hospital  pre-op appointment visit. Nothing else to drink after completing the  Pre-Surgery Clear Ensure or G2.          If you have questions, please contact your surgeon's  office.   FOLLOW BOWEL PREP AND ANY ADDITIONAL PRE OP INSTRUCTIONS YOU RECEIVED FROM YOUR SURGEON'S OFFICE!!!     Oral Hygiene is also important to reduce your risk of infection.                                    Remember - BRUSH YOUR TEETH THE MORNING OF SURGERY WITH YOUR REGULAR TOOTHPASTE   Do NOT smoke after Midnight   Take these  medicines the morning of surgery with A SIP OF WATER: bupropion.Tylenol as needed.  DO NOT TAKE ANY ORAL DIABETIC MEDICATIONS DAY OF YOUR SURGERY  Bring CPAP mask and tubing day of surgery.                              You may not have any metal on your body including hair pins, jewelry, and body piercing             Do not wear make-up, lotions, powders, perfumes/cologne, or deodorant  Do not wear nail polish including gel and S&S, artificial/acrylic nails, or any other type of covering on natural nails including finger and toenails. If you have artificial nails, gel coating, etc. that needs to be removed by a nail salon please have this removed prior to surgery or surgery may need to be canceled/ delayed if the surgeon/ anesthesia feels like they are unable to be safely monitored.   Do not shave  48 hours prior to surgery.    Do not bring valuables to the hospital. [facility] IS NOT             RESPONSIBLE   FOR VALUABLES.   Contacts, dentures or bridgework may not be worn into surgery.   Bring small overnight bag day of surgery.    Patients discharged on the day of surgery will not be allowed to drive home.  Someone NEEDS to stay with you for the first 24 hours after anesthesia.   Special Instructions: Bring a copy of your healthcare power of attorney and living will documents         the day of surgery if you haven't scanned them before.              Please read over the following fact sheets you were given: IF YOU HAVE QUESTIONS ABOUT YOUR PRE-OP INSTRUCTIONS PLEASE CALL [phone]     [facility] - Preparing for Surgery Before surgery,  you can play an important role.  Because skin is not sterile, your skin needs to be as free of germs as possible.  You can reduce the number of germs on your skin by washing with CHG (chlorahexidine gluconate) soap before surgery.  CHG is an antiseptic cleaner which kills germs and bonds with the skin to continue killing germs even after washing. Please DO NOT use if you have an allergy to CHG or antibacterial soaps.  If your skin becomes reddened/irritated stop using the CHG and inform your nurse when you arrive at Short Stay. Do not shave (including legs and underarms) for at least 48 hours prior to the first CHG shower.  You may shave your face/neck. Please follow these instructions carefully:  1.  Shower with CHG Soap the night before surgery and the  morning of Surgery.  2.  If you choose to wash your hair, wash your hair first as usual with your  normal  shampoo.  3.  After you shampoo, rinse your hair and body thoroughly to remove the  shampoo.                           4.  Use CHG as you would any other liquid soap.  You can apply chg directly  to the skin and wash  Gently with a scrungie or clean washcloth.  5.  Apply the CHG Soap to your body ONLY FROM THE NECK DOWN.   Do not use on face/ open                           Wound or open sores. Avoid contact with eyes, ears mouth and genitals (private parts).                       Wash face,  Genitals (private parts) with your normal soap.             6.  Wash thoroughly, paying special attention to the area where your surgery  will be performed.  7.  Thoroughly rinse your body with warm water from the neck down.  8.  DO NOT shower/wash with your normal soap after using and rinsing off  the CHG Soap.                9.  Pat yourself dry with a clean towel.            10.  Wear clean pajamas.            11.  Place clean sheets on your bed the night of your first shower and do not  sleep with pets. Day of Surgery : Do not  apply any lotions/deodorants the morning of surgery.  Please wear clean clothes to the hospital/surgery center.  FAILURE TO FOLLOW THESE INSTRUCTIONS MAY RESULT IN THE CANCELLATION OF YOUR SURGERY PATIENT SIGNATURE_________________________________  NURSE SIGNATURE__________________________________  ________________________________________________________________________ [facility]- Preparing for Total Shoulder Arthroplasty    Before surgery, you can play an important role. Because skin is not sterile, your skin needs to be as free of germs as possible. You can reduce the number of germs on your skin by using the following products. Benzoyl Peroxide Gel Reduces the number of germs present on the skin Applied twice a day to shoulder area starting two days before surgery    ==================================================================  Please follow these instructions carefully:  BENZOYL PEROXIDE 5% GEL  Please do not use if you have an allergy to benzoyl peroxide.   If your skin becomes reddened/irritated stop using the benzoyl peroxide.  Starting two days before surgery, apply as follows: Apply benzoyl peroxide in the morning and at night. Apply after taking a shower. If you are not taking a shower clean entire shoulder front, back, and side along with the armpit with a clean wet washcloth.  Place a quarter-sized dollop on your shoulder and rub in thoroughly, making sure to cover the front, back, and side of your shoulder, along with the armpit.   2 days before ____ AM   ____ PM              1 day before ____ AM   ____ PM                         Do this twice a day for two days.  (Last application is the night before surgery, AFTER using the CHG soap as described below).  Do NOT apply benzoyl peroxide gel on the day of surgery.   Incentive Spirometer  An incentive spirometer is a tool that can help keep your lungs clear and active. This tool measures how well you are filling  your lungs with each breath. Taking long deep breaths may help  reverse or decrease the chance of developing breathing (pulmonary) problems (especially infection) following: A long period of time when you are unable to move or be active. BEFORE THE PROCEDURE  If the spirometer includes an indicator to show your best effort, your nurse or respiratory therapist will set it to a desired goal. If possible, sit up straight or lean slightly forward. Try not to slouch. Hold the incentive spirometer in an upright position. INSTRUCTIONS FOR USE  Sit on the edge of your bed if possible, or sit up as far as you can in bed or on a chair. Hold the incentive spirometer in an upright position. Breathe out normally. Place the mouthpiece in your mouth and seal your lips tightly around it. Breathe in slowly and as deeply as possible, raising the piston or the ball toward the top of the column. Hold your breath for 3-5 seconds or for as long as possible. Allow the piston or ball to fall to the bottom of the column. Remove the mouthpiece from your mouth and breathe out normally. Rest for a few seconds and repeat Steps 1 through 7 at least 10 times every 1-2 hours when you are awake. Take your time and take a few normal breaths between deep breaths. The spirometer may include an indicator to show your best effort. Use the indicator as a goal to work toward during each repetition. After each set of 10 deep breaths, practice coughing to be sure your lungs are clear. If you have an incision (the cut made at the time of surgery), support your incision when coughing by placing a pillow or rolled up towels firmly against it. Once you are able to get out of bed, walk around indoors and cough well. You may stop using the incentive spirometer when instructed by your caregiver.  RISKS AND COMPLICATIONS Take your time so you do not get dizzy or light-headed. If you are in pain, you may need to take or ask for pain medication  before doing incentive spirometry. It is harder to take a deep breath if you are having pain. AFTER USE Rest and breathe slowly and easily. It can be helpful to keep track of a log of your progress. Your caregiver can provide you with a simple table to help with this. If you are using the spirometer at home, follow these instructions: SEEK MEDICAL CARE IF:  You are having difficultly using the spirometer. You have trouble using the spirometer as often as instructed. Your pain medication is not giving enough relief while using the spirometer. You develop fever of 100.5 F (38.1 C) or higher. SEEK IMMEDIATE MEDICAL CARE IF:  You cough up bloody sputum that had not been present before. You develop fever of 102 F (38.9 C) or greater. You develop worsening pain at or near the incision site. MAKE SURE YOU:  Understand these instructions. Will watch your condition. Will get help right away if you are not doing well or get worse. Document Released: 07/30/2006 Document Revised: 06/11/2011 Document Reviewed: 09/30/2006 [facility] Patient Information 2014 [place], [place].   ________________________________________________________________________

## 2021-08-24 ENCOUNTER — Encounter (HOSPITAL_COMMUNITY)
Admission: RE | Admit: 2021-08-24 | Discharge: 2021-08-24 | Disposition: A | Discharge status: Home / Self Care | Payer: BC Managed Care – PPO | Source: Ambulatory Visit | Attending: Orthopedic Surgery | Admitting: Orthopedic Surgery

## 2021-08-24 ENCOUNTER — Other Ambulatory Visit: Payer: Self-pay

## 2021-08-24 ENCOUNTER — Encounter (HOSPITAL_COMMUNITY): Payer: Self-pay

## 2021-08-24 VITALS — BP 113/75 | HR 65 | Temp 98.2°F | Ht 67.0 in | Wt 146.0 lb

## 2021-08-24 DIAGNOSIS — Z01812 Encounter for preprocedural laboratory examination: Secondary | ICD-10-CM | POA: Insufficient documentation

## 2021-08-24 DIAGNOSIS — Z01818 Encounter for other preprocedural examination: Secondary | ICD-10-CM

## 2021-08-24 HISTORY — DX: Depression, unspecified: F32.A

## 2021-08-24 LAB — CBC
HCT: 39.1 % (ref 36.0–46.0)
Hemoglobin: 13.4 g/dL (ref 12.0–15.0)
MCH: 32.2 pg (ref 26.0–34.0)
MCHC: 34.3 g/dL (ref 30.0–36.0)
MCV: 94 fL (ref 80.0–100.0)
Platelets: 282 10*3/uL (ref 150–400)
RBC: 4.16 MIL/uL (ref 3.87–5.11)
RDW: 12 % (ref 11.5–15.5)
WBC: 6.4 10*3/uL (ref 4.0–10.5)
nRBC: 0 % (ref 0.0–0.2)

## 2021-08-24 LAB — SURGICAL PCR SCREEN
MRSA, PCR: NEGATIVE
Staphylococcus aureus: NEGATIVE

## 2021-08-24 NOTE — Progress Notes (Signed)
For Short Stay: [place] appointment date: Date of COVID positive in last 18 days:  Bowel Prep reminder:   For Anesthesia: PCP - Dr. Shirline Frees Cardiologist -   Chest x-ray -  EKG - 03/28/21 Stress Test -  ECHO -  Cardiac Cath -  Pacemaker/ICD device last checked: Pacemaker orders received: Device Rep notified:  Spinal Cord Stimulator:  Sleep Study -  CPAP -   Fasting Blood Sugar -  Checks Blood Sugar _____ times a day Date and result of last Hgb A1c-  Blood Thinner Instructions: Aspirin Instructions: Last Dose:  Activity level: Can go up a flight of stairs and activities of daily living without stopping and without chest pain and/or shortness of breath   Able to exercise without chest pain and/or shortness of breath   Unable to go up a flight of stairs without chest pain and/or shortness of breath     Anesthesia review:   Patient denies shortness of breath, fever, cough and chest pain at PAT appointment   Patient verbalized understanding of instructions that were given to them at the PAT appointment. Patient was also instructed that they will need to review over the PAT instructions again at home before surgery.

## 2021-09-07 ENCOUNTER — Encounter (HOSPITAL_COMMUNITY): Admission: RE | Payer: Self-pay | Source: Ambulatory Visit

## 2021-09-07 ENCOUNTER — Ambulatory Visit (HOSPITAL_COMMUNITY)
Admission: RE | Admit: 2021-09-07 | Payer: BC Managed Care – PPO | Source: Ambulatory Visit | Admitting: Orthopedic Surgery

## 2021-09-07 SURGERY — REVISION, TOTAL ARTHROPLASTY, SHOULDER
Anesthesia: General | Site: Shoulder | Laterality: Left

## 2021-09-14 ENCOUNTER — Other Ambulatory Visit: Payer: Self-pay | Admitting: Obstetrics & Gynecology

## 2021-09-14 ENCOUNTER — Other Ambulatory Visit: Payer: Self-pay | Admitting: Sports Medicine

## 2021-09-14 DIAGNOSIS — Z1231 Encounter for screening mammogram for malignant neoplasm of breast: Secondary | ICD-10-CM

## 2021-10-17 ENCOUNTER — Ambulatory Visit
Admission: RE | Admit: 2021-10-17 | Discharge: 2021-10-17 | Disposition: A | Discharge status: Home / Self Care | Payer: BC Managed Care – PPO | Source: Ambulatory Visit | Attending: Obstetrics & Gynecology | Admitting: Obstetrics & Gynecology

## 2021-10-17 DIAGNOSIS — Z1231 Encounter for screening mammogram for malignant neoplasm of breast: Secondary | ICD-10-CM

## 2021-10-25 ENCOUNTER — Telehealth: Payer: Self-pay

## 2021-10-25 ENCOUNTER — Ambulatory Visit (INDEPENDENT_AMBULATORY_CARE_PROVIDER_SITE_OTHER): Payer: BC Managed Care – PPO | Admitting: Obstetrics & Gynecology

## 2021-10-25 ENCOUNTER — Encounter: Payer: Self-pay | Admitting: Obstetrics & Gynecology

## 2021-10-25 VITALS — BP 112/74 | HR 56 | Temp 98.9°F

## 2021-10-25 DIAGNOSIS — Z8619 Personal history of other infectious and parasitic diseases: Secondary | ICD-10-CM

## 2021-10-25 DIAGNOSIS — R350 Frequency of micturition: Secondary | ICD-10-CM | POA: Diagnosis not present

## 2021-10-25 LAB — URINALYSIS, COMPLETE W/RFL CULTURE
Bacteria, UA: NONE SEEN /HPF
Bilirubin Urine: NEGATIVE
Glucose, UA: NEGATIVE
Hgb urine dipstick: NEGATIVE
Hyaline Cast: NONE SEEN /LPF
Ketones, ur: NEGATIVE
Leukocyte Esterase: NEGATIVE
Nitrites, Initial: NEGATIVE
Protein, ur: NEGATIVE
RBC / HPF: NONE SEEN /HPF (ref 0–2)
Specific Gravity, Urine: 1.01 (ref 1.001–1.035)
WBC, UA: NONE SEEN /HPF (ref 0–5)
pH: 7 (ref 5.0–8.0)

## 2021-10-25 LAB — NO CULTURE INDICATED

## 2021-10-25 MED ORDER — VALACYCLOVIR HCL 1 G PO TABS
1000.0000 mg | ORAL_TABLET | Freq: Two times a day (BID) | ORAL | 3 refills | Status: DC
Start: 2021-10-25 — End: 2021-11-09

## 2021-10-25 NOTE — Telephone Encounter (Signed)
AEX scheduled for 11/14/21. Patient would like to come the week before to have her lab work done. Please advise regarding orders.

## 2021-10-25 NOTE — Progress Notes (Signed)
    Christine Rangel [facility] 1960-12-12 [id number]        61 y.o.  G2P0A2 New boyfriend   RP: Pin prick sensation around the urethra x a few days  HPI: New boyfriend.  Only had oral sex.  Boyfriend's STI screen showed HVS 1-2 AB pos, but he has no notion of any HSV outbreak.  C/O a pin prick sensation around the urethra x a few days.  Some urinary frequency.  No vulvar lesion seen.  No pelvic pain, no vaginal discharge.  Postmenopause, well on no HRT.  No PMB.     OB History  Gravida Para Term Preterm AB Living  2       2 0  SAB IAB Ectopic Multiple Live Births  2            # Outcome Date GA Lbr Len/2nd Weight Sex Delivery Anes PTL Lv  2 SAB           1 SAB             Past medical history,surgical history, problem list, medications, allergies, family history and social history were all reviewed and documented in the EPIC chart.   Directed ROS with pertinent positives and negatives documented in the history of present illness/assessment and plan.  Exam:  Vitals:   10/25/21 1003  BP: 112/74  Pulse: (!) 56  Temp: 98.9 F (37.2 C)  TempSrc: Oral  SpO2: 98%   General appearance:  Normal   Gynecologic exam: Vulva completely normal.  No periurethral lesion, no vesicle or ulcer.  U/A completely negative   Assessment/Plan:  61 y.o. G2P0020   1. Frequency of urination New boyfriend.  Only had oral sex.  Boyfriend's STI screen showed HVS 1-2 AB pos, but he has no notion of any HSV outbreak.  C/O a pin prick sensation around the urethra x a few days.  Some urinary frequency.  No vulvar lesion seen.  No pelvic pain, no vaginal discharge.  Postmenopause, well on no HRT.  No PMB.   - Urinalysis,Complete w/RFL Culture  2. H/O herpes labialis No current outbreak.  Valacyclovir prescription sent to pharmacy.  Other orders - valACYclovir (VALTREX) 1000 MG tablet; Take 1 tablet (1,000 mg total) by mouth 2 (two) times daily for 3 days.   Genia Del MD, 10:35 AM 10/25/2021

## 2021-10-26 ENCOUNTER — Other Ambulatory Visit: Payer: Self-pay

## 2021-10-26 DIAGNOSIS — Z1329 Encounter for screening for other suspected endocrine disorder: Secondary | ICD-10-CM

## 2021-10-26 DIAGNOSIS — Z13220 Encounter for screening for lipoid disorders: Secondary | ICD-10-CM

## 2021-10-26 DIAGNOSIS — Z01419 Encounter for gynecological examination (general) (routine) without abnormal findings: Secondary | ICD-10-CM

## 2021-10-26 DIAGNOSIS — Z1321 Encounter for screening for nutritional disorder: Secondary | ICD-10-CM

## 2021-10-26 NOTE — Telephone Encounter (Signed)
Genia Del, MD  You 20 minutes ago (10:55 AM)    Yes, can order fasting labs: CBC, CMP, TSH, FLP, Vit D.

## 2021-10-26 NOTE — Telephone Encounter (Signed)
Spoke with patient and informed her ok for labs week before. Orders placed and lab appt scheduled.

## 2021-11-06 ENCOUNTER — Other Ambulatory Visit: Payer: BC Managed Care – PPO

## 2021-11-06 DIAGNOSIS — Z13220 Encounter for screening for lipoid disorders: Secondary | ICD-10-CM

## 2021-11-06 DIAGNOSIS — Z1321 Encounter for screening for nutritional disorder: Secondary | ICD-10-CM

## 2021-11-06 DIAGNOSIS — Z01419 Encounter for gynecological examination (general) (routine) without abnormal findings: Secondary | ICD-10-CM

## 2021-11-06 DIAGNOSIS — Z1329 Encounter for screening for other suspected endocrine disorder: Secondary | ICD-10-CM

## 2021-11-07 LAB — COMPREHENSIVE METABOLIC PANEL
AG Ratio: 1.4 (calc) (ref 1.0–2.5)
ALT: 19 U/L (ref 6–29)
AST: 20 U/L (ref 10–35)
Albumin: 4.2 g/dL (ref 3.6–5.1)
Alkaline phosphatase (APISO): 69 U/L (ref 37–153)
BUN: 17 mg/dL (ref 7–25)
CO2: 29 mmol/L (ref 20–32)
Calcium: 9.7 mg/dL (ref 8.6–10.4)
Chloride: 103 mmol/L (ref 98–110)
Creat: 0.78 mg/dL (ref 0.50–1.03)
Globulin: 2.9 g/dL (calc) (ref 1.9–3.7)
Glucose, Bld: 89 mg/dL (ref 65–99)
Potassium: 4.1 mmol/L (ref 3.5–5.3)
Sodium: 138 mmol/L (ref 135–146)
Total Bilirubin: 0.5 mg/dL (ref 0.2–1.2)
Total Protein: 7.1 g/dL (ref 6.1–8.1)

## 2021-11-07 LAB — CBC WITH DIFFERENTIAL/PLATELET
Absolute Monocytes: 433 cells/uL (ref 200–950)
Basophils Absolute: 19 cells/uL (ref 0–200)
Basophils Relative: 0.5 %
Eosinophils Absolute: 190 cells/uL (ref 15–500)
Eosinophils Relative: 5 %
HCT: 36.7 % (ref 35.0–45.0)
Hemoglobin: 12.9 g/dL (ref 11.7–15.5)
Lymphs Abs: 1463 cells/uL (ref 850–3900)
MCH: 32.6 pg (ref 27.0–33.0)
MCHC: 35.1 g/dL (ref 32.0–36.0)
MCV: 92.7 fL (ref 80.0–100.0)
MPV: 9.6 fL (ref 7.5–12.5)
Monocytes Relative: 11.4 %
Neutro Abs: 1695 cells/uL (ref 1500–7800)
Neutrophils Relative %: 44.6 %
Platelets: 277 10*3/uL (ref 140–400)
RBC: 3.96 10*6/uL (ref 3.80–5.10)
RDW: 12 % (ref 11.0–15.0)
Total Lymphocyte: 38.5 %
WBC: 3.8 10*3/uL (ref 3.8–10.8)

## 2021-11-07 LAB — LIPID PANEL
Cholesterol: 154 mg/dL (ref <200)
HDL: 42 mg/dL — ABNORMAL LOW (ref >=50)
LDL Cholesterol (Calc): 98 mg/dL (calc)
Non-HDL Cholesterol (Calc): 112 mg/dL (calc) (ref <130)
Total CHOL/HDL Ratio: 3.7 (calc) (ref <5.0)
Triglycerides: 62 mg/dL (ref <150)

## 2021-11-07 LAB — TSH: TSH: 1.77 mIU/L (ref 0.40–4.50)

## 2021-11-07 LAB — VITAMIN D 25 HYDROXY (VIT D DEFICIENCY, FRACTURES): Vit D, 25-Hydroxy: 41 ng/mL (ref 30–100)

## 2021-11-08 ENCOUNTER — Other Ambulatory Visit: Payer: Self-pay | Admitting: Obstetrics & Gynecology

## 2021-11-08 NOTE — Telephone Encounter (Signed)
Last annual exam 10/2020 Scheduled on 11/14/21 

## 2021-11-09 ENCOUNTER — Other Ambulatory Visit: Payer: Self-pay | Admitting: Obstetrics & Gynecology

## 2021-11-10 MED ORDER — VALACYCLOVIR HCL 1 G PO TABS: 1000.0000 mg | ORAL_TABLET | Freq: Two times a day (BID) | ORAL | 3 refills | Status: AC

## 2021-11-10 MED ORDER — VALACYCLOVIR HCL 1 G PO TABS: 1000.0000 mg | ORAL_TABLET | Freq: Two times a day (BID) | ORAL | 0 refills | Status: DC

## 2021-11-10 NOTE — Telephone Encounter (Signed)
Per ML "Can you check what the pharmacy wants?  The prescription is for a lot of tablets, given that it is for recurrences, not for prophylaxis.  I recommend prescribing #30, refill x 3. "   I called and left detailed message on pharmacy voicemail to cancel 180 tablet and I will send new Rx.

## 2021-11-10 NOTE — Telephone Encounter (Signed)
Rx was sent on 11/09/21 and approved for #30 tablets with 3 refills. Pharmacy sent message on Rx "Pharmacy comment: REQUEST FOR 90 DAYS PRESCRIPTION.  Rx sent.

## 2021-11-14 ENCOUNTER — Ambulatory Visit (INDEPENDENT_AMBULATORY_CARE_PROVIDER_SITE_OTHER): Payer: BC Managed Care – PPO | Admitting: Obstetrics & Gynecology

## 2021-11-14 ENCOUNTER — Encounter: Payer: Self-pay | Admitting: Obstetrics & Gynecology

## 2021-11-14 VITALS — BP 124/82 | HR 60 | Ht 67.0 in | Wt 151.0 lb

## 2021-11-14 DIAGNOSIS — N83209 Unspecified ovarian cyst, unspecified side: Secondary | ICD-10-CM | POA: Diagnosis not present

## 2021-11-14 DIAGNOSIS — Z01419 Encounter for gynecological examination (general) (routine) without abnormal findings: Secondary | ICD-10-CM | POA: Diagnosis not present

## 2021-11-14 DIAGNOSIS — Z780 Asymptomatic menopausal state: Secondary | ICD-10-CM | POA: Diagnosis not present

## 2021-11-14 DIAGNOSIS — N83201 Unspecified ovarian cyst, right side: Secondary | ICD-10-CM

## 2021-11-14 NOTE — Progress Notes (Addendum)
Christine Rangel [facility] 02-Feb-1961 [id number]   History:    61 y.o.  G2P0A2   RP:  Established patient presenting for annual gyn exam    HPI: Postmenopause, well on no HRT.  No PMB.  No pelvic pain.  Pap 10/2020 Neg.  No h/o abnormal Pap.  Repeat Pap at 2-3 yrs. Left ovarian cyst incidentally discovered on CT imaging performed at [facility] on 03/16/2020 in preparation for her to donate a kidney.  Pelvic US here 09/15/2020 was stable c/w [place] 05/2020 with small simple bilateral ovarian follicle remnants and a Right Hydrosalpinx.  Breasts normal.  Mammo 09/2021 Neg.  BMI 23.65.  Good fitness and nutrition, but likes sugars. Health labs with Fam MD.  Alen Bleacher 2021.   Past medical history,surgical history, family history and social history were all reviewed and documented in the EPIC chart.  Gynecologic History No LMP recorded. Patient is postmenopausal.  Obstetric History OB History  Gravida Para Term Preterm AB Living  2       2 0  SAB IAB Ectopic Multiple Live Births  2            # Outcome Date GA Lbr Len/2nd Weight Sex Delivery Anes PTL Lv  2 SAB           1 SAB              ROS: A ROS was performed and pertinent positives and negatives are included in the history. GENERAL: No fevers or chills. HEENT: No change in vision, no earache, sore throat or sinus congestion. NECK: No pain or stiffness. CARDIOVASCULAR: No chest pain or pressure. No palpitations. PULMONARY: No shortness of breath, cough or wheeze. GASTROINTESTINAL: No abdominal pain, nausea, vomiting or diarrhea, melena or bright red blood per rectum. GENITOURINARY: No urinary frequency, urgency, hesitancy or dysuria. MUSCULOSKELETAL: No joint or muscle pain, no back pain, no recent trauma. DERMATOLOGIC: No rash, no itching, no lesions. ENDOCRINE: No polyuria, polydipsia, no heat or cold intolerance. No recent change in weight. HEMATOLOGICAL: No anemia or easy bruising or bleeding. NEUROLOGIC: No headache, seizures, numbness, tingling or weakness.  PSYCHIATRIC: No depression, no loss of interest in normal activity or change in sleep pattern.     Exam:   BP 124/82   Pulse 60   Ht 5\' 7"  (1.702 m)   Wt 151 lb (68.5 kg)   SpO2 97%   BMI 23.65 kg/m   Body mass index is 23.65 kg/m.  General appearance : Well developed well nourished female. No acute distress HEENT: Eyes: no retinal hemorrhage or exudates,  Neck supple, trachea midline, no carotid bruits, no thyroidmegaly Lungs: Clear to auscultation, no rhonchi or wheezes, or rib retractions  Heart: Regular rate and rhythm, no murmurs or gallops Breast:Examined in sitting and supine position were symmetrical in appearance, no palpable masses or tenderness,  no skin retraction, no nipple inversion, no nipple discharge, no skin discoloration, no axillary or supraclavicular lymphadenopathy Abdomen: no palpable masses or tenderness, no rebound or guarding Extremities: no edema or skin discoloration or tenderness  Pelvic: Vulva: Normal             Vagina: No gross lesions or discharge  Cervix: No gross lesions or discharge  Uterus  AV, normal size, shape and consistency, non-tender and mobile  Adnexa  Without masses or tenderness  Anus: Normal   Assessment/Plan:  61 y.o. female for annual exam   1. Well female exam with routine gynecological exam Postmenopause, well on no HRT.  No PMB.  No pelvic pain.  Pap 10/2020 Neg.  No h/o abnormal Pap.  Repeat Pap at 2-3 yrs. Left ovarian cyst incidentally discovered on CT imaging performed at [facility] on 03/16/2020 in preparation for her to donate a kidney.  Pelvic US here 09/15/2020 was stable c/w [place] 05/2020 with small simple bilateral ovarian follicle remnants and a Right Hydrosalpinx.  Breasts normal.  Mammo 09/2021 Neg.  BMI 23.65.  Good fitness and nutrition, but likes sugars. Health labs with Fam MD.  Alen Bleacher 2021.  2. Postmenopause Postmenopause, well on no HRT.  No PMB.  No pelvic pain.   3. Bilateral ovarian cysts Bilateral stable ovarian  cysts on Pelvic US 08/2020.  Fam h/o Ovarian Ca, second degree.  F/U Pelvic US to reassess the bilateral Ovarian Cysts. - US Transvaginal Non-OB; Future  Other orders - ibuprofen (ADVIL) 800 MG tablet; TAKE 1 TABLET BY MOUTH 3 TIMES A DAY FOR 21 DAYS - GLUCOSAMINE-CHONDROITIN PO; Take by mouth.   Genia Del MD, 10:19 AM 11/14/2021

## 2021-11-16 ENCOUNTER — Ambulatory Visit (INDEPENDENT_AMBULATORY_CARE_PROVIDER_SITE_OTHER): Payer: BC Managed Care – PPO

## 2021-11-16 DIAGNOSIS — M7751 Other enthesopathy of right foot: Secondary | ICD-10-CM

## 2021-11-16 DIAGNOSIS — M722 Plantar fascial fibromatosis: Secondary | ICD-10-CM

## 2021-11-16 NOTE — Progress Notes (Signed)
Patient presents today to be casted for custom molded orthotics. Dr. Charlsie Merles has been treating patient for plantar fasciitis.   Impression foam cast was taken. ABN signed.  Patient info-  Shoe size: 9 medium women's  Shoe style: athletic shoes  Height: 5'7"  Weight: 150lb    Patient will be notified once orthotics arrive in office and reappoint for fitting at that time.

## 2021-12-14 ENCOUNTER — Other Ambulatory Visit: Payer: BC Managed Care – PPO | Admitting: Obstetrics & Gynecology

## 2021-12-14 ENCOUNTER — Other Ambulatory Visit: Payer: BC Managed Care – PPO

## 2021-12-25 ENCOUNTER — Telehealth: Payer: Self-pay | Admitting: Podiatry

## 2021-12-25 NOTE — Telephone Encounter (Signed)
Please check with [facility] concerning orthotics

## 2021-12-25 NOTE — Telephone Encounter (Signed)
Pt has called for over 2 weeks inquiring about her orthotics. Pt stated no one has gotten back with her. Pt wants to know where her orthotics are. Pt was last seen 11/16/21  Please advise

## 2021-12-28 ENCOUNTER — Ambulatory Visit (INDEPENDENT_AMBULATORY_CARE_PROVIDER_SITE_OTHER): Payer: BC Managed Care – PPO | Admitting: Obstetrics & Gynecology

## 2021-12-28 ENCOUNTER — Encounter: Payer: Self-pay | Admitting: Obstetrics & Gynecology

## 2021-12-28 ENCOUNTER — Ambulatory Visit (INDEPENDENT_AMBULATORY_CARE_PROVIDER_SITE_OTHER): Payer: BC Managed Care – PPO

## 2021-12-28 VITALS — BP 110/60 | HR 56

## 2021-12-28 DIAGNOSIS — N83202 Unspecified ovarian cyst, left side: Secondary | ICD-10-CM

## 2021-12-28 DIAGNOSIS — N83201 Unspecified ovarian cyst, right side: Secondary | ICD-10-CM

## 2021-12-28 DIAGNOSIS — Z780 Asymptomatic menopausal state: Secondary | ICD-10-CM

## 2021-12-28 NOTE — Progress Notes (Signed)
    [place] 01/08/1961 [id number]        60 y.o.  G2P0020   RP: Bilateral Ovarian Cysts for Pelvic US  HPI:  No pelvic pain.  Postmenopause, well on no HRT.  Left ovarian cyst incidentally discovered on CT imaging performed at [facility] on 03/16/2020 in preparation for her to donate a kidney.  Pelvic US here 09/15/2020 was stable c/w [place] 05/2020 with small simple bilateral ovarian follicle remnants and a Right Hydrosalpinx.    OB History  Gravida Para Term Preterm AB Living  2       2 0  SAB IAB Ectopic Multiple Live Births  2            # Outcome Date GA Lbr Len/2nd Weight Sex Delivery Anes PTL Lv  2 SAB           1 SAB             Past medical history,surgical history, problem list, medications, allergies, family history and social history were all reviewed and documented in the EPIC chart.   Directed ROS with pertinent positives and negatives documented in the history of present illness/assessment and plan.  Exam:  Vitals:   12/28/21 1204  BP: 110/60  Pulse: (!) 56  SpO2: 98%   General appearance:  Normal  Pelvic US today: T/V images.  Comparison is made with previous scan on June 16's 2022.  Anteverted atrophic uterus normal in shape with no myometrial mass.  The uterus is measured at 4.71 x 2.91 x 1.73 cm.  The endometrial lining is thin and symmetrical measured at 1.02 mm with no obvious mass or thickening.  Right ovary is atrophic in appearance with a small simple avascular right ovarian remnant of the follicle measured at 1.1 x 1 cm.  This remnant of a follicle was previously measured at 0.9 x 0.9 cm.  Positive perfusion to the right ovary.  The left ovary presents a simple appearing avascular cyst again noted measuring 3.3 x 2.6 x 2.9 cm.  That simple cyst was previously measured at 2.8 x 2.6 cm.  Positive perfusion to the left ovary.  Right hydrosalpinx seen again measured at 4.6 x 1.3 cm.  Similar to June 2022.  Suboptimal visualization due to bowels with gas.  Trace free  fluid in the pelvis.   Assessment/Plan:  61 y.o. G2P0020   1. Bilateral ovarian cysts No pelvic pain.  Postmenopause, well on no HRT.  Left ovarian cyst incidentally discovered on CT imaging performed at [facility] on 03/16/2020 in preparation for her to donate a kidney.  Pelvic US here 09/15/2020 was stable c/w [place] 05/2020 with small simple bilateral ovarian follicle remnants and a Right Hydrosalpinx. Pelvic US findings stable with benign appearing remnant follicles/simple cysts.  Uterus normal, endometrium thin.  Trace FF in the pelvis.  Patient reassured.  Will repeat a Pelvic US annually with her Gyn exam  2. Postmenopause Well on no HRT.  No PMB.  Other orders - CREATINE PO; Take by mouth. - COLLAGEN PO; Take by mouth.   Princess Bruins MD, 12:14 PM 12/28/2021

## 2022-01-04 ENCOUNTER — Other Ambulatory Visit: Payer: Self-pay | Admitting: *Deleted

## 2022-01-04 DIAGNOSIS — N83201 Unspecified ovarian cyst, right side: Secondary | ICD-10-CM

## 2022-01-29 ENCOUNTER — Telehealth: Payer: Self-pay | Admitting: Podiatry

## 2022-01-29 NOTE — Telephone Encounter (Signed)
Orthotics are in contacted pt to come in for pick up, awaiting call back for appt

## 2022-01-29 NOTE — Telephone Encounter (Signed)
LVM for pt to call back for an appt for OPU  

## 2022-02-14 ENCOUNTER — Ambulatory Visit (INDEPENDENT_AMBULATORY_CARE_PROVIDER_SITE_OTHER): Payer: BC Managed Care – PPO

## 2022-02-14 DIAGNOSIS — M65311 Trigger thumb, right thumb: Secondary | ICD-10-CM | POA: Diagnosis not present

## 2022-02-14 DIAGNOSIS — M722 Plantar fascial fibromatosis: Secondary | ICD-10-CM

## 2022-02-14 NOTE — Progress Notes (Signed)
Patient presents today to pick up custom molded foot orthotics, diagnosed with plantar fasciitis by Dr. Charlsie Merles.   Orthotics were dispensed and fit was satisfactory. Reviewed instructions for break-in and wear. Written instructions given to patient.  Patient will follow up as needed.   [facility] - order # [id number]

## 2022-03-16 DIAGNOSIS — L570 Actinic keratosis: Secondary | ICD-10-CM | POA: Diagnosis not present

## 2022-03-23 DIAGNOSIS — H0100A Unspecified blepharitis right eye, upper and lower eyelids: Secondary | ICD-10-CM | POA: Diagnosis not present

## 2022-03-23 DIAGNOSIS — H00015 Hordeolum externum left lower eyelid: Secondary | ICD-10-CM | POA: Diagnosis not present

## 2022-03-23 DIAGNOSIS — H0100B Unspecified blepharitis left eye, upper and lower eyelids: Secondary | ICD-10-CM | POA: Diagnosis not present

## 2022-04-16 DIAGNOSIS — H0014 Chalazion left upper eyelid: Secondary | ICD-10-CM | POA: Diagnosis not present

## 2022-04-16 DIAGNOSIS — H0015 Chalazion left lower eyelid: Secondary | ICD-10-CM | POA: Diagnosis not present

## 2022-04-24 DIAGNOSIS — F902 Attention-deficit hyperactivity disorder, combined type: Secondary | ICD-10-CM | POA: Diagnosis not present

## 2022-04-24 DIAGNOSIS — F3342 Major depressive disorder, recurrent, in full remission: Secondary | ICD-10-CM | POA: Diagnosis not present

## 2022-06-26 DIAGNOSIS — L570 Actinic keratosis: Secondary | ICD-10-CM | POA: Diagnosis not present

## 2022-06-26 DIAGNOSIS — L500 Allergic urticaria: Secondary | ICD-10-CM | POA: Diagnosis not present

## 2022-06-26 DIAGNOSIS — L309 Dermatitis, unspecified: Secondary | ICD-10-CM | POA: Diagnosis not present

## 2022-06-26 DIAGNOSIS — D485 Neoplasm of uncertain behavior of skin: Secondary | ICD-10-CM | POA: Diagnosis not present

## 2022-07-03 DIAGNOSIS — M7651 Patellar tendinitis, right knee: Secondary | ICD-10-CM | POA: Diagnosis not present

## 2022-07-12 DIAGNOSIS — M25561 Pain in right knee: Secondary | ICD-10-CM | POA: Diagnosis not present

## 2022-07-19 DIAGNOSIS — H1089 Other conjunctivitis: Secondary | ICD-10-CM | POA: Diagnosis not present

## 2022-07-23 DIAGNOSIS — H10413 Chronic giant papillary conjunctivitis, bilateral: Secondary | ICD-10-CM | POA: Diagnosis not present

## 2022-07-25 DIAGNOSIS — M25561 Pain in right knee: Secondary | ICD-10-CM | POA: Diagnosis not present

## 2022-07-27 DIAGNOSIS — M25561 Pain in right knee: Secondary | ICD-10-CM | POA: Diagnosis not present

## 2022-07-30 DIAGNOSIS — M25561 Pain in right knee: Secondary | ICD-10-CM | POA: Diagnosis not present

## 2022-08-27 DIAGNOSIS — N3001 Acute cystitis with hematuria: Secondary | ICD-10-CM | POA: Diagnosis not present

## 2022-09-18 ENCOUNTER — Other Ambulatory Visit: Payer: Self-pay | Admitting: Obstetrics and Gynecology

## 2022-09-18 DIAGNOSIS — Z1231 Encounter for screening mammogram for malignant neoplasm of breast: Secondary | ICD-10-CM

## 2022-10-10 DIAGNOSIS — H2513 Age-related nuclear cataract, bilateral: Secondary | ICD-10-CM | POA: Diagnosis not present

## 2022-10-10 DIAGNOSIS — H01004 Unspecified blepharitis left upper eyelid: Secondary | ICD-10-CM | POA: Diagnosis not present

## 2022-10-10 DIAGNOSIS — H524 Presbyopia: Secondary | ICD-10-CM | POA: Diagnosis not present

## 2022-10-22 ENCOUNTER — Ambulatory Visit
Admission: RE | Admit: 2022-10-22 | Discharge: 2022-10-22 | Disposition: A | Discharge status: Home / Self Care | Payer: BC Managed Care – PPO | Source: Ambulatory Visit | Attending: Obstetrics and Gynecology | Admitting: Obstetrics and Gynecology

## 2022-10-22 DIAGNOSIS — Z1231 Encounter for screening mammogram for malignant neoplasm of breast: Secondary | ICD-10-CM

## 2022-11-19 DIAGNOSIS — H00011 Hordeolum externum right upper eyelid: Secondary | ICD-10-CM | POA: Diagnosis not present

## 2022-11-20 ENCOUNTER — Ambulatory Visit: Payer: BC Managed Care – PPO | Admitting: Obstetrics & Gynecology

## 2022-11-21 DIAGNOSIS — C44311 Basal cell carcinoma of skin of nose: Secondary | ICD-10-CM | POA: Diagnosis not present

## 2022-11-22 ENCOUNTER — Other Ambulatory Visit: Payer: BC Managed Care – PPO | Admitting: Obstetrics & Gynecology

## 2022-11-22 ENCOUNTER — Other Ambulatory Visit: Payer: BC Managed Care – PPO

## 2022-11-22 DIAGNOSIS — Z01419 Encounter for gynecological examination (general) (routine) without abnormal findings: Secondary | ICD-10-CM | POA: Diagnosis not present

## 2022-11-22 DIAGNOSIS — N7011 Chronic salpingitis: Secondary | ICD-10-CM | POA: Diagnosis not present

## 2022-11-22 DIAGNOSIS — Z0000 Encounter for general adult medical examination without abnormal findings: Secondary | ICD-10-CM | POA: Diagnosis not present

## 2022-11-22 DIAGNOSIS — Z803 Family history of malignant neoplasm of breast: Secondary | ICD-10-CM | POA: Diagnosis not present

## 2022-11-22 DIAGNOSIS — N83202 Unspecified ovarian cyst, left side: Secondary | ICD-10-CM | POA: Diagnosis not present

## 2022-12-10 DIAGNOSIS — C44311 Basal cell carcinoma of skin of nose: Secondary | ICD-10-CM | POA: Diagnosis not present

## 2022-12-20 DIAGNOSIS — N83202 Unspecified ovarian cyst, left side: Secondary | ICD-10-CM | POA: Diagnosis not present

## 2022-12-20 DIAGNOSIS — N7011 Chronic salpingitis: Secondary | ICD-10-CM | POA: Diagnosis not present

## 2023-02-07 DIAGNOSIS — M7651 Patellar tendinitis, right knee: Secondary | ICD-10-CM | POA: Diagnosis not present

## 2023-02-07 DIAGNOSIS — M25561 Pain in right knee: Secondary | ICD-10-CM | POA: Diagnosis not present

## 2023-02-11 DIAGNOSIS — M25561 Pain in right knee: Secondary | ICD-10-CM | POA: Diagnosis not present

## 2023-02-15 DIAGNOSIS — S83241A Other tear of medial meniscus, current injury, right knee, initial encounter: Secondary | ICD-10-CM | POA: Diagnosis not present

## 2023-02-15 DIAGNOSIS — M7651 Patellar tendinitis, right knee: Secondary | ICD-10-CM | POA: Diagnosis not present

## 2023-02-19 DIAGNOSIS — H6121 Impacted cerumen, right ear: Secondary | ICD-10-CM | POA: Diagnosis not present

## 2023-02-19 DIAGNOSIS — F3342 Major depressive disorder, recurrent, in full remission: Secondary | ICD-10-CM | POA: Diagnosis not present

## 2023-03-25 DIAGNOSIS — M11261 Other chondrocalcinosis, right knee: Secondary | ICD-10-CM | POA: Diagnosis not present

## 2023-03-25 DIAGNOSIS — S83231A Complex tear of medial meniscus, current injury, right knee, initial encounter: Secondary | ICD-10-CM | POA: Diagnosis not present

## 2023-03-25 DIAGNOSIS — Y999 Unspecified external cause status: Secondary | ICD-10-CM | POA: Diagnosis not present

## 2023-03-25 DIAGNOSIS — M67863 Other specified disorders of tendon, right knee: Secondary | ICD-10-CM | POA: Diagnosis not present

## 2023-03-25 DIAGNOSIS — G8918 Other acute postprocedural pain: Secondary | ICD-10-CM | POA: Diagnosis not present

## 2023-03-25 DIAGNOSIS — M65961 Unspecified synovitis and tenosynovitis, right lower leg: Secondary | ICD-10-CM | POA: Diagnosis not present

## 2023-03-25 DIAGNOSIS — M94261 Chondromalacia, right knee: Secondary | ICD-10-CM | POA: Diagnosis not present

## 2023-04-02 DIAGNOSIS — M25561 Pain in right knee: Secondary | ICD-10-CM | POA: Diagnosis not present

## 2023-04-04 DIAGNOSIS — L910 Hypertrophic scar: Secondary | ICD-10-CM | POA: Diagnosis not present

## 2023-04-10 DIAGNOSIS — M25561 Pain in right knee: Secondary | ICD-10-CM | POA: Diagnosis not present

## 2023-04-24 DIAGNOSIS — R053 Chronic cough: Secondary | ICD-10-CM | POA: Diagnosis not present

## 2023-07-05 DIAGNOSIS — K047 Periapical abscess without sinus: Secondary | ICD-10-CM | POA: Diagnosis not present

## 2023-07-11 DIAGNOSIS — L821 Other seborrheic keratosis: Secondary | ICD-10-CM | POA: Diagnosis not present

## 2023-07-11 DIAGNOSIS — D225 Melanocytic nevi of trunk: Secondary | ICD-10-CM | POA: Diagnosis not present

## 2023-07-11 DIAGNOSIS — L578 Other skin changes due to chronic exposure to nonionizing radiation: Secondary | ICD-10-CM | POA: Diagnosis not present

## 2023-08-15 DIAGNOSIS — Z6824 Body mass index (BMI) 24.0-24.9, adult: Secondary | ICD-10-CM | POA: Diagnosis not present

## 2023-08-15 DIAGNOSIS — J029 Acute pharyngitis, unspecified: Secondary | ICD-10-CM | POA: Diagnosis not present

## 2023-08-15 DIAGNOSIS — K1379 Other lesions of oral mucosa: Secondary | ICD-10-CM | POA: Diagnosis not present

## 2023-08-29 DIAGNOSIS — D649 Anemia, unspecified: Secondary | ICD-10-CM | POA: Diagnosis not present

## 2023-08-29 DIAGNOSIS — K1379 Other lesions of oral mucosa: Secondary | ICD-10-CM | POA: Diagnosis not present

## 2023-08-29 DIAGNOSIS — E559 Vitamin D deficiency, unspecified: Secondary | ICD-10-CM | POA: Diagnosis not present

## 2023-08-29 DIAGNOSIS — Z6824 Body mass index (BMI) 24.0-24.9, adult: Secondary | ICD-10-CM | POA: Diagnosis not present

## 2023-09-24 ENCOUNTER — Other Ambulatory Visit: Payer: Self-pay | Admitting: Obstetrics and Gynecology

## 2023-09-24 DIAGNOSIS — Z1231 Encounter for screening mammogram for malignant neoplasm of breast: Secondary | ICD-10-CM

## 2023-10-11 DIAGNOSIS — H01005 Unspecified blepharitis left lower eyelid: Secondary | ICD-10-CM | POA: Diagnosis not present

## 2023-10-11 DIAGNOSIS — H01002 Unspecified blepharitis right lower eyelid: Secondary | ICD-10-CM | POA: Diagnosis not present

## 2023-10-11 DIAGNOSIS — H2513 Age-related nuclear cataract, bilateral: Secondary | ICD-10-CM | POA: Diagnosis not present

## 2023-10-11 DIAGNOSIS — H5203 Hypermetropia, bilateral: Secondary | ICD-10-CM | POA: Diagnosis not present

## 2023-10-25 ENCOUNTER — Ambulatory Visit
Admission: RE | Admit: 2023-10-25 | Discharge: 2023-10-25 | Disposition: A | Discharge status: Home / Self Care | Source: Ambulatory Visit | Attending: Obstetrics and Gynecology | Admitting: Obstetrics and Gynecology

## 2023-10-25 ENCOUNTER — Ambulatory Visit

## 2023-10-25 DIAGNOSIS — L821 Other seborrheic keratosis: Secondary | ICD-10-CM | POA: Diagnosis not present

## 2023-10-25 DIAGNOSIS — Z1231 Encounter for screening mammogram for malignant neoplasm of breast: Secondary | ICD-10-CM

## 2023-10-25 DIAGNOSIS — M7989 Other specified soft tissue disorders: Secondary | ICD-10-CM | POA: Diagnosis not present

## 2023-10-25 DIAGNOSIS — D485 Neoplasm of uncertain behavior of skin: Secondary | ICD-10-CM | POA: Diagnosis not present

## 2023-10-30 ENCOUNTER — Other Ambulatory Visit: Payer: Self-pay | Admitting: Obstetrics and Gynecology

## 2023-10-30 DIAGNOSIS — R928 Other abnormal and inconclusive findings on diagnostic imaging of breast: Secondary | ICD-10-CM

## 2023-11-04 DIAGNOSIS — R300 Dysuria: Secondary | ICD-10-CM | POA: Diagnosis not present

## 2023-11-08 ENCOUNTER — Ambulatory Visit
Admission: RE | Admit: 2023-11-08 | Discharge: 2023-11-08 | Disposition: A | Discharge status: Home / Self Care | Source: Ambulatory Visit | Attending: Obstetrics and Gynecology | Admitting: Obstetrics and Gynecology

## 2023-11-08 DIAGNOSIS — R928 Other abnormal and inconclusive findings on diagnostic imaging of breast: Secondary | ICD-10-CM

## 2023-11-08 DIAGNOSIS — R92322 Mammographic fibroglandular density, left breast: Secondary | ICD-10-CM | POA: Diagnosis not present

## 2023-11-08 DIAGNOSIS — N6489 Other specified disorders of breast: Secondary | ICD-10-CM | POA: Diagnosis not present

## 2023-11-11 ENCOUNTER — Institutional Professional Consult (permissible substitution) (INDEPENDENT_AMBULATORY_CARE_PROVIDER_SITE_OTHER): Admitting: Otolaryngology

## 2024-01-07 DIAGNOSIS — L578 Other skin changes due to chronic exposure to nonionizing radiation: Secondary | ICD-10-CM | POA: Diagnosis not present

## 2024-01-07 DIAGNOSIS — D2262 Melanocytic nevi of left upper limb, including shoulder: Secondary | ICD-10-CM | POA: Diagnosis not present

## 2024-01-07 DIAGNOSIS — L821 Other seborrheic keratosis: Secondary | ICD-10-CM | POA: Diagnosis not present

## 2024-01-07 DIAGNOSIS — D2272 Melanocytic nevi of left lower limb, including hip: Secondary | ICD-10-CM | POA: Diagnosis not present

## 2024-01-08 DIAGNOSIS — M65312 Trigger thumb, left thumb: Secondary | ICD-10-CM | POA: Diagnosis not present

## 2024-01-21 DIAGNOSIS — Z6825 Body mass index (BMI) 25.0-25.9, adult: Secondary | ICD-10-CM | POA: Diagnosis not present

## 2024-01-21 DIAGNOSIS — N390 Urinary tract infection, site not specified: Secondary | ICD-10-CM | POA: Diagnosis not present

## 2024-02-06 DIAGNOSIS — N3000 Acute cystitis without hematuria: Secondary | ICD-10-CM | POA: Diagnosis not present

## 2024-02-06 DIAGNOSIS — R350 Frequency of micturition: Secondary | ICD-10-CM | POA: Diagnosis not present

## 2024-03-05 DIAGNOSIS — M65312 Trigger thumb, left thumb: Secondary | ICD-10-CM | POA: Diagnosis not present

## 2024-03-06 DIAGNOSIS — Z6825 Body mass index (BMI) 25.0-25.9, adult: Secondary | ICD-10-CM | POA: Diagnosis not present

## 2024-03-06 DIAGNOSIS — F3342 Major depressive disorder, recurrent, in full remission: Secondary | ICD-10-CM | POA: Diagnosis not present
